# Patient Record
Sex: Female | Born: 1950 | Race: White | Hispanic: No | Marital: Married | Smoking: Never smoker
Health system: Southern US, Community
[De-identification: ages and names within clinical notes are randomized; demographics above are authoritative.]

## PROBLEM LIST (undated history)

## (undated) DIAGNOSIS — M1990 Unspecified osteoarthritis, unspecified site: Secondary | ICD-10-CM

## (undated) DIAGNOSIS — N6310 Unspecified lump in the right breast, unspecified quadrant: Secondary | ICD-10-CM

## (undated) DIAGNOSIS — Z8489 Family history of other specified conditions: Secondary | ICD-10-CM

## (undated) DIAGNOSIS — E039 Hypothyroidism, unspecified: Secondary | ICD-10-CM

## (undated) HISTORY — PX: COLONOSCOPY: SHX174

## (undated) HISTORY — PX: NO PAST SURGERIES: SHX2092

## (undated) HISTORY — PX: JOINT REPLACEMENT: SHX530

---

## 2016-09-28 ENCOUNTER — Other Ambulatory Visit: Payer: Self-pay | Admitting: Internal Medicine

## 2016-09-28 DIAGNOSIS — N6489 Other specified disorders of breast: Secondary | ICD-10-CM

## 2016-10-03 ENCOUNTER — Other Ambulatory Visit: Payer: Self-pay | Admitting: Internal Medicine

## 2016-10-03 DIAGNOSIS — N6489 Other specified disorders of breast: Secondary | ICD-10-CM

## 2016-10-04 ENCOUNTER — Ambulatory Visit
Admission: RE | Admit: 2016-10-04 | Discharge: 2016-10-04 | Disposition: A | Discharge status: Home / Self Care | Payer: BC Managed Care – PPO | Source: Ambulatory Visit | Attending: Internal Medicine | Admitting: Internal Medicine

## 2016-10-04 ENCOUNTER — Other Ambulatory Visit: Payer: Self-pay | Admitting: Internal Medicine

## 2016-10-04 DIAGNOSIS — N6489 Other specified disorders of breast: Secondary | ICD-10-CM

## 2017-03-22 ENCOUNTER — Ambulatory Visit: Payer: Self-pay | Admitting: Surgery

## 2017-03-22 DIAGNOSIS — N6091 Unspecified benign mammary dysplasia of right breast: Secondary | ICD-10-CM

## 2017-03-22 NOTE — H&P (Signed)
History of Present Illness  The patient is a 66 year old female who presents with a breast mass. Referred by Dr. Albertina Senegal for evaluation of atypical lobular hyperplasia right breast  Original mammograms performed in [place]  This is a 66 yo female who presents with an abnormal screening mammogram in early October 2017. Her last previous mammogram was in 2015. The screening mammogram showed some architectural distortion in the upper outer quadrant on the right. She underwent stereotactic biopsy which showed focal atypical lobular hyperplasia. A clip was placed. She presents now for surgical consultation.  Menarche - age 89 First pregnancy - age 31 Breastfeed - yes Hormones - OCP x 20 years Menopause - early 64's Family history - none  CLINICAL DATA: Screening detected architectural distortion involving the upper outer quadrant of the right breast at middle depth.  EXAM: RIGHT BREAST STEREOTACTIC CORE NEEDLE BIOPSY  COMPARISON: Previous exams.  FINDINGS: The patient and I discussed the procedure of stereotactic-guided biopsy including benefits and alternatives. We discussed the high likelihood of a successful procedure. We discussed the risks of the procedure including infection, bleeding, tissue injury, clip migration, and inadequate sampling. Informed written consent was given. The usual time out protocol was performed immediately prior to the procedure.  Using sterile technique with chlorhexidine as skin antisepsis, 1% lidocaine and 1% lidocaine with epinephrine as local anesthetic, under stereotactic guidance, a 9 gauge vacuum assisted device was used to perform core needle biopsy of architectural distortion in the upper outer quadrant of the right breast using a superior approach. Specimen radiograph was not performed as there are no associated calcifications.  At the conclusion of the procedure, a coil shaped tissue marker clip was deployed into the biopsy  cavity. Follow-up 2-view mammogram was performed and dictated separately.  IMPRESSION: Stereotactic-guided biopsy of architectural distortion involving the upper outer quadrant of the right breast. No apparent complications.  Electronically Signed: By: Hulan Saas M.D. On: 10/04/2016 10:31  ADDENDUM REPORT: 10/06/2016 14:13  ADDENDUM: Pathology revealed ATROPHIC BREAST TISSUE WITH FIBROCYSTIC CHANGES, PATCHY FIBROSIS AND FOCAL ATYPICAL LOBULAR HYPERPLASIA, BENIGN DUCTS WITH MICROCALCIFICATIONS of the upper outer quadrant of the Left breast. The focal atypical lobular hyperplasia may represent an incidental finding. This was found to be concordant by Dr. Quincy Carnes.  Pathology results were discussed with the patient by telephone. The patient reported doing well after the biopsy with tenderness at the site. Post biopsy instructions and care were reviewed and questions were answered. The patient was encouraged to call [facility] for any additional concerns. Surgical consultation has been arranged with Dr. Manus Rudd at [facility] on October 09, 2016.  Pathology results reported by Rene Kocher, RN on 10/06/2016.   Electronically Signed By: Hulan Saas M.D. On: 10/06/2016 14:13  Diagnosis Breast, right, needle core biopsy, upper inner quadrant at middle depth - ATROPHIC BREAST TISSUE WITH FIBROCYSTIC CHANGES, PATCHY FIBROSIS AND FOCAL ATYPICAL LOBULAR HYPERPLASIA. - BENIGN DUCTS WITH MICROCALCIFICATIONS. - SEE COMMENT. Microscopic Comment The focal atypical lobular hyperplasia may represent an incidental finding. Please correlate closely with clinical and radiologic impression. Dr. Leticia Penna has seen this case in consultation with agreement of the above diagnosis and comment. The findings are called to [facility] on 10/06/16. Of note, an E-Cadherin immunohistochemical stain is performed. The  morphology coupled with the staining pattern is consistent with the above diagnosis. (RH:ds 10/05/16) Zandra Abts MD Pathologist, Electronic Signature (Case signed 10/06/2016)   Other Problems Thyroid Disease  Past Surgical History Breast Biopsy  Right.  Diagnostic Studies History  Colonoscopy  >10 years ago Mammogram  within last year Pap Smear  1-5 years ago  Allergies  No Known Drug Allergies 10/09/2016  Medication History  Synthroid ( Tablet, Oral) Active. Medications Reconciled  Social History  Alcohol use  Occasional alcohol use. Caffeine use  Coffee, Tea. Illicit drug use  Remotely quit drug use. Tobacco use  Never smoker.  Family History Arthritis  Mother. Hypertension  Brother, Father, Mother. Prostate Cancer  Father. Thyroid problems  Mother.  Pregnancy / Birth History  Age at menarche  12 years. Age of menopause  37-55 Gravida  3 Length (months) of breastfeeding  >1 Maternal age  68-30 Para  2    Review of Systems  General Not Present- Appetite Loss, Chills, Fatigue, Fever, Night Sweats, Weight Gain and Weight Loss. Skin Not Present- Change in Wart/Mole, Dryness, Hives, Jaundice, New Lesions, Non-Healing Wounds, Rash and Ulcer. HEENT Not Present- Earache, Hearing Loss, Hoarseness, Nose Bleed, Oral Ulcers, Ringing in the Ears, Seasonal Allergies, Sinus Pain, Sore Throat, Visual Disturbances, Wears glasses/contact lenses and Yellow Eyes. Respiratory Not Present- Bloody sputum, Chronic Cough, Difficulty Breathing, Snoring and Wheezing. Breast Present- Breast Mass. Not Present- Breast Pain, Nipple Discharge and Skin Changes. Cardiovascular Not Present- Chest Pain, Difficulty Breathing Lying Down, Leg Cramps, Palpitations, Rapid Heart Rate, Shortness of Breath and Swelling of Extremities. Gastrointestinal Not Present- Abdominal Pain, Bloating, Bloody Stool, Change in Bowel Habits, Chronic diarrhea, Constipation, Difficulty  Swallowing, Excessive gas, Gets full quickly at meals, Hemorrhoids, Indigestion, Nausea, Rectal Pain and Vomiting. Female Genitourinary Not Present- Frequency, Nocturia, Painful Urination, Pelvic Pain and Urgency. Musculoskeletal Not Present- Back Pain, Joint Pain, Joint Stiffness, Muscle Pain, Muscle Weakness and Swelling of Extremities. Neurological Not Present- Decreased Memory, Fainting, Headaches, Numbness, Seizures, Tingling, Tremor, Trouble walking and Weakness. Psychiatric Not Present- Anxiety, Bipolar, Change in Sleep Pattern, Depression, Fearful and Frequent crying. Endocrine Not Present- Cold Intolerance, Excessive Hunger, Hair Changes, Heat Intolerance, Hot flashes and New Diabetes. Hematology Not Present- Blood Thinners, Easy Bruising, Excessive bleeding, Gland problems, HIV and Persistent Infections.  Vitals  Weight: 195 lb Height: 63in Body Surface Area: 1.91 m Body Mass Index: 34.54 kg/m  Temp.: 97.48F(Temporal)  Pulse: 79 (Regular)  BP: 132/80 (Sitting, Left Arm, Standard)       Physical Exam The physical exam findings are as follows: Note:WDWN in NAD Eyes: Pupils equal, round; sclera anicteric HENT: Oral mucosa moist; good dentition Neck: No masses palpated, no thyromegaly Breasts: symmetric; no nipple retraction or discharge; no palpable masses; no axillary lymphadenopathy Lungs: CTA bilaterally; normal respiratory effort CV: Regular rate and rhythm; no murmurs; extremities well-perfused with no edema Abd: +bowel sounds, soft, non-tender, no palpable organomegaly; no palpable hernias Skin: Warm, dry; no sign of jaundice Psychiatric - alert and oriented x 4; calm mood and affect    Assessment & Plan  ATYPICAL LOBULAR HYPERPLASIA (ALH) OF RIGHT BREAST (N60.91) Impression: Upper outer quadrant Current Plans We will proceed with a right radioactive seed localized lumpectomy.  The surgical procedure has been discussed with the patient.  Potential  risks, benefits, alternative treatments, and expected outcomes have been explained.  All of the patient's questions at this time have been answered.  The likelihood of reaching the patient's treatment goal is good.  The patient understand the proposed surgical procedure and wishes to proceed.  Note:I spent approximately 30 minutes of this 40 minute visit discussing the situation with the patient. We recommend right seed  localized lumpectomy to completely excise this area to rule out malignancy. I explained the entire procedure to the patient, including benefits and risks. I explained that the presence of atypical cells increased her overall risk of breast cancer.   Wilmon ArmsMatthew K. Corliss Skainssuei, MD, [facility]  General/ Trauma Surgery  03/22/2017 12:45 PM

## 2017-04-02 ENCOUNTER — Other Ambulatory Visit: Payer: Self-pay | Admitting: Surgery

## 2017-04-02 DIAGNOSIS — N6091 Unspecified benign mammary dysplasia of right breast: Secondary | ICD-10-CM

## 2017-05-08 ENCOUNTER — Encounter (HOSPITAL_BASED_OUTPATIENT_CLINIC_OR_DEPARTMENT_OTHER): Payer: Self-pay | Admitting: *Deleted

## 2017-05-14 ENCOUNTER — Ambulatory Visit
Admission: RE | Admit: 2017-05-14 | Discharge: 2017-05-14 | Disposition: A | Discharge status: Home / Self Care | Payer: Medicare Other | Source: Ambulatory Visit | Attending: Surgery | Admitting: Surgery

## 2017-05-14 DIAGNOSIS — N6091 Unspecified benign mammary dysplasia of right breast: Secondary | ICD-10-CM

## 2017-05-14 NOTE — Progress Notes (Signed)
Boost drink given with instructions to complete by 0400 dos, pt verbalized understanding. 

## 2017-05-15 ENCOUNTER — Ambulatory Visit (HOSPITAL_BASED_OUTPATIENT_CLINIC_OR_DEPARTMENT_OTHER): Payer: Medicare Other | Admitting: Anesthesiology

## 2017-05-15 ENCOUNTER — Ambulatory Visit
Admission: RE | Admit: 2017-05-15 | Discharge: 2017-05-15 | Disposition: A | Discharge status: Home / Self Care | Payer: Medicare Other | Source: Ambulatory Visit | Attending: Surgery | Admitting: Surgery

## 2017-05-15 ENCOUNTER — Ambulatory Visit (HOSPITAL_BASED_OUTPATIENT_CLINIC_OR_DEPARTMENT_OTHER)
Admission: RE | Admit: 2017-05-15 | Discharge: 2017-05-15 | Disposition: A | Discharge status: Home / Self Care | Payer: Medicare Other | Source: Ambulatory Visit | Attending: Surgery | Admitting: Surgery

## 2017-05-15 ENCOUNTER — Encounter (HOSPITAL_BASED_OUTPATIENT_CLINIC_OR_DEPARTMENT_OTHER)
Admission: RE | Disposition: A | Discharge status: Home / Self Care | Payer: Self-pay | Source: Ambulatory Visit | Attending: Surgery

## 2017-05-15 ENCOUNTER — Encounter (HOSPITAL_BASED_OUTPATIENT_CLINIC_OR_DEPARTMENT_OTHER): Payer: Self-pay | Admitting: *Deleted

## 2017-05-15 DIAGNOSIS — N6091 Unspecified benign mammary dysplasia of right breast: Secondary | ICD-10-CM | POA: Insufficient documentation

## 2017-05-15 DIAGNOSIS — Z8249 Family history of ischemic heart disease and other diseases of the circulatory system: Secondary | ICD-10-CM | POA: Diagnosis not present

## 2017-05-15 DIAGNOSIS — Z8042 Family history of malignant neoplasm of prostate: Secondary | ICD-10-CM | POA: Diagnosis not present

## 2017-05-15 DIAGNOSIS — Z8349 Family history of other endocrine, nutritional and metabolic diseases: Secondary | ICD-10-CM | POA: Diagnosis not present

## 2017-05-15 DIAGNOSIS — E039 Hypothyroidism, unspecified: Secondary | ICD-10-CM | POA: Diagnosis not present

## 2017-05-15 DIAGNOSIS — Z79890 Hormone replacement therapy (postmenopausal): Secondary | ICD-10-CM | POA: Diagnosis not present

## 2017-05-15 DIAGNOSIS — Z79899 Other long term (current) drug therapy: Secondary | ICD-10-CM | POA: Diagnosis not present

## 2017-05-15 DIAGNOSIS — Z8261 Family history of arthritis: Secondary | ICD-10-CM | POA: Diagnosis not present

## 2017-05-15 HISTORY — DX: Hypothyroidism, unspecified: E03.9

## 2017-05-15 HISTORY — DX: Unspecified lump in the right breast, unspecified quadrant: N63.10

## 2017-05-15 HISTORY — PX: BREAST LUMPECTOMY WITH RADIOACTIVE SEED LOCALIZATION: SHX6424

## 2017-05-15 SURGERY — BREAST LUMPECTOMY WITH RADIOACTIVE SEED LOCALIZATION
Anesthesia: General | Site: Breast | Laterality: Right

## 2017-05-15 MED ORDER — LIDOCAINE HCL (CARDIAC) 20 MG/ML IV SOLN
INTRAVENOUS | Status: DC | PRN
  Administered 2017-05-15: 50 mg via INTRAVENOUS

## 2017-05-15 MED ORDER — PROPOFOL 10 MG/ML IV BOLUS
INTRAVENOUS | Status: DC | PRN
  Administered 2017-05-15: 200 mg via INTRAVENOUS

## 2017-05-15 MED ORDER — DEXAMETHASONE SODIUM PHOSPHATE 10 MG/ML IJ SOLN
INTRAMUSCULAR | Status: AC
  Filled 2017-05-15: qty 1

## 2017-05-15 MED ORDER — FENTANYL CITRATE (PF) 100 MCG/2ML IJ SOLN
INTRAMUSCULAR | Status: AC
  Filled 2017-05-15: qty 2

## 2017-05-15 MED ORDER — ONDANSETRON HCL 4 MG/2ML IJ SOLN
INTRAMUSCULAR | Status: DC | PRN
  Administered 2017-05-15: 4 mg via INTRAVENOUS

## 2017-05-15 MED ORDER — LACTATED RINGERS IV SOLN: INTRAVENOUS | Status: DC

## 2017-05-15 MED ORDER — BUPIVACAINE HCL (PF) 0.25 % IJ SOLN
INTRAMUSCULAR | Status: AC
  Filled 2017-05-15: qty 30

## 2017-05-15 MED ORDER — BUPIVACAINE HCL (PF) 0.25 % IJ SOLN
INTRAMUSCULAR | Status: DC | PRN
  Administered 2017-05-15: 10 mL

## 2017-05-15 MED ORDER — HYDROCODONE-ACETAMINOPHEN 5-325 MG PO TABS: 1.0000 | ORAL_TABLET | Freq: Four times a day (QID) | ORAL | 0 refills | Status: DC | PRN

## 2017-05-15 MED ORDER — GABAPENTIN 300 MG PO CAPS
ORAL_CAPSULE | ORAL | Status: AC
  Filled 2017-05-15: qty 1

## 2017-05-15 MED ORDER — PHENYLEPHRINE 40 MCG/ML (10ML) SYRINGE FOR IV PUSH (FOR BLOOD PRESSURE SUPPORT)
PREFILLED_SYRINGE | INTRAVENOUS | Status: AC
  Filled 2017-05-15: qty 10

## 2017-05-15 MED ORDER — PROPOFOL 500 MG/50ML IV EMUL
INTRAVENOUS | Status: AC
  Filled 2017-05-15: qty 50

## 2017-05-15 MED ORDER — ROCURONIUM BROMIDE 10 MG/ML (PF) SYRINGE
PREFILLED_SYRINGE | INTRAVENOUS | Status: AC
  Filled 2017-05-15: qty 5

## 2017-05-15 MED ORDER — CEFAZOLIN SODIUM-DEXTROSE 2-4 GM/100ML-% IV SOLN
INTRAVENOUS | Status: AC
  Filled 2017-05-15: qty 100

## 2017-05-15 MED ORDER — CHLORHEXIDINE GLUCONATE CLOTH 2 % EX PADS: 6.0000 | MEDICATED_PAD | Freq: Once | CUTANEOUS | Status: DC

## 2017-05-15 MED ORDER — MIDAZOLAM HCL 2 MG/2ML IJ SOLN
1.0000 mg | INTRAMUSCULAR | Status: DC | PRN
  Administered 2017-05-15: 2 mg via INTRAVENOUS

## 2017-05-15 MED ORDER — CELECOXIB 400 MG PO CAPS
400.0000 mg | ORAL_CAPSULE | ORAL | Status: AC
  Administered 2017-05-15: 400 mg via ORAL

## 2017-05-15 MED ORDER — CELECOXIB 200 MG PO CAPS
ORAL_CAPSULE | ORAL | Status: AC
  Filled 2017-05-15: qty 2

## 2017-05-15 MED ORDER — FENTANYL CITRATE (PF) 100 MCG/2ML IJ SOLN: 25.0000 ug | INTRAMUSCULAR | Status: DC | PRN

## 2017-05-15 MED ORDER — EPHEDRINE SULFATE 50 MG/ML IJ SOLN
INTRAMUSCULAR | Status: DC | PRN
  Administered 2017-05-15: 10 mg via INTRAVENOUS

## 2017-05-15 MED ORDER — SCOPOLAMINE 1 MG/3DAYS TD PT72: 1.0000 | MEDICATED_PATCH | Freq: Once | TRANSDERMAL | Status: DC | PRN

## 2017-05-15 MED ORDER — ACETAMINOPHEN 500 MG PO TABS
ORAL_TABLET | ORAL | Status: AC
  Filled 2017-05-15: qty 2

## 2017-05-15 MED ORDER — GABAPENTIN 300 MG PO CAPS
300.0000 mg | ORAL_CAPSULE | ORAL | Status: AC
  Administered 2017-05-15: 300 mg via ORAL

## 2017-05-15 MED ORDER — EPHEDRINE 5 MG/ML INJ
INTRAVENOUS | Status: AC
  Filled 2017-05-15: qty 10

## 2017-05-15 MED ORDER — LACTATED RINGERS IV SOLN
INTRAVENOUS | Status: DC
  Administered 2017-05-15 (×3): via INTRAVENOUS

## 2017-05-15 MED ORDER — ONDANSETRON HCL 4 MG/2ML IJ SOLN
INTRAMUSCULAR | Status: AC
  Filled 2017-05-15: qty 2

## 2017-05-15 MED ORDER — FENTANYL CITRATE (PF) 100 MCG/2ML IJ SOLN
50.0000 ug | INTRAMUSCULAR | Status: DC | PRN
  Administered 2017-05-15: 100 ug via INTRAVENOUS

## 2017-05-15 MED ORDER — ACETAMINOPHEN 500 MG PO TABS
1000.0000 mg | ORAL_TABLET | ORAL | Status: AC
  Administered 2017-05-15: 1000 mg via ORAL

## 2017-05-15 MED ORDER — SUCCINYLCHOLINE CHLORIDE 200 MG/10ML IV SOSY
PREFILLED_SYRINGE | INTRAVENOUS | Status: AC
  Filled 2017-05-15: qty 10

## 2017-05-15 MED ORDER — METOCLOPRAMIDE HCL 5 MG/ML IJ SOLN: 10.0000 mg | Freq: Once | INTRAMUSCULAR | Status: DC | PRN

## 2017-05-15 MED ORDER — MIDAZOLAM HCL 2 MG/2ML IJ SOLN
INTRAMUSCULAR | Status: AC
  Filled 2017-05-15: qty 2

## 2017-05-15 MED ORDER — MEPERIDINE HCL 25 MG/ML IJ SOLN: 6.2500 mg | INTRAMUSCULAR | Status: DC | PRN

## 2017-05-15 MED ORDER — DEXAMETHASONE SODIUM PHOSPHATE 4 MG/ML IJ SOLN
INTRAMUSCULAR | Status: DC | PRN
  Administered 2017-05-15: 10 mg via INTRAVENOUS

## 2017-05-15 MED ORDER — LIDOCAINE 2% (20 MG/ML) 5 ML SYRINGE
INTRAMUSCULAR | Status: AC
  Filled 2017-05-15: qty 5

## 2017-05-15 MED ORDER — CEFAZOLIN SODIUM-DEXTROSE 2-4 GM/100ML-% IV SOLN
2.0000 g | INTRAVENOUS | Status: AC
  Administered 2017-05-15: 2 g via INTRAVENOUS

## 2017-05-15 SURGICAL SUPPLY — 53 items
APPLIER CLIP 9.375 MED OPEN (MISCELLANEOUS) ×2
BENZOIN TINCTURE PRP APPL 2/3 (GAUZE/BANDAGES/DRESSINGS) ×2 IMPLANT
BLADE HEX COATED 2.75 (ELECTRODE) ×2 IMPLANT
BLADE SURG 15 STRL LF DISP TIS (BLADE) ×1 IMPLANT
BLADE SURG 15 STRL SS (BLADE) ×1
CANISTER SUC SOCK COL 7IN (MISCELLANEOUS) IMPLANT
CANISTER SUCT 1200ML W/VALVE (MISCELLANEOUS) ×2 IMPLANT
CHLORAPREP W/TINT 26ML (MISCELLANEOUS) ×2 IMPLANT
CLIP APPLIE 9.375 MED OPEN (MISCELLANEOUS) ×1 IMPLANT
COVER BACK TABLE 60X90IN (DRAPES) ×2 IMPLANT
COVER MAYO STAND STRL (DRAPES) ×2 IMPLANT
COVER PROBE W GEL 5X96 (DRAPES) ×2 IMPLANT
DECANTER SPIKE VIAL GLASS SM (MISCELLANEOUS) IMPLANT
DEVICE DUBIN W/COMP PLATE 8390 (MISCELLANEOUS) ×2 IMPLANT
DRAPE LAPAROTOMY 100X72 PEDS (DRAPES) ×2 IMPLANT
DRAPE UTILITY XL STRL (DRAPES) ×2 IMPLANT
DRSG TEGADERM 4X4.75 (GAUZE/BANDAGES/DRESSINGS) ×2 IMPLANT
ELECT BLADE 4.0 EZ CLEAN MEGAD (MISCELLANEOUS) ×2
ELECT REM PT RETURN 9FT ADLT (ELECTROSURGICAL) ×2
ELECTRODE BLDE 4.0 EZ CLN MEGD (MISCELLANEOUS) ×1 IMPLANT
ELECTRODE REM PT RTRN 9FT ADLT (ELECTROSURGICAL) ×1 IMPLANT
GAUZE SPONGE 4X4 12PLY STRL LF (GAUZE/BANDAGES/DRESSINGS) IMPLANT
GLOVE BIO SURGEON STRL SZ7 (GLOVE) ×2 IMPLANT
GLOVE BIOGEL PI IND STRL 7.0 (GLOVE) ×1 IMPLANT
GLOVE BIOGEL PI IND STRL 7.5 (GLOVE) ×1 IMPLANT
GLOVE BIOGEL PI INDICATOR 7.0 (GLOVE) ×1
GLOVE BIOGEL PI INDICATOR 7.5 (GLOVE) ×1
GLOVE ECLIPSE 6.5 STRL STRAW (GLOVE) ×2 IMPLANT
GLOVE EXAM NITRILE EXT CUFF MD (GLOVE) ×2 IMPLANT
GOWN STRL REUS W/ TWL LRG LVL3 (GOWN DISPOSABLE) ×2 IMPLANT
GOWN STRL REUS W/TWL LRG LVL3 (GOWN DISPOSABLE) ×2
ILLUMINATOR WAVEGUIDE N/F (MISCELLANEOUS) IMPLANT
KIT MARKER MARGIN INK (KITS) ×2 IMPLANT
LIGHT WAVEGUIDE WIDE FLAT (MISCELLANEOUS) IMPLANT
NEEDLE HYPO 25X1 1.5 SAFETY (NEEDLE) ×2 IMPLANT
NS IRRIG 1000ML POUR BTL (IV SOLUTION) ×2 IMPLANT
PACK BASIN DAY SURGERY FS (CUSTOM PROCEDURE TRAY) ×2 IMPLANT
PENCIL BUTTON HOLSTER BLD 10FT (ELECTRODE) ×2 IMPLANT
SLEEVE SCD COMPRESS KNEE MED (MISCELLANEOUS) ×2 IMPLANT
SPONGE GAUZE 2X2 8PLY STRL LF (GAUZE/BANDAGES/DRESSINGS) IMPLANT
SPONGE LAP 18X18 X RAY DECT (DISPOSABLE) IMPLANT
SPONGE LAP 4X18 X RAY DECT (DISPOSABLE) ×2 IMPLANT
STRIP CLOSURE SKIN 1/2X4 (GAUZE/BANDAGES/DRESSINGS) ×2 IMPLANT
SUT MON AB 4-0 PC3 18 (SUTURE) ×2 IMPLANT
SUT SILK 2 0 SH (SUTURE) IMPLANT
SUT VIC AB 3-0 SH 27 (SUTURE) ×1
SUT VIC AB 3-0 SH 27X BRD (SUTURE) ×1 IMPLANT
SYR BULB 3OZ (MISCELLANEOUS) IMPLANT
SYR CONTROL 10ML LL (SYRINGE) ×2 IMPLANT
TOWEL OR 17X24 6PK STRL BLUE (TOWEL DISPOSABLE) ×2 IMPLANT
TOWEL OR NON WOVEN STRL DISP B (DISPOSABLE) IMPLANT
TUBE CONNECTING 20X1/4 (TUBING) ×2 IMPLANT
YANKAUER SUCT BULB TIP NO VENT (SUCTIONS) ×2 IMPLANT

## 2017-05-15 NOTE — Anesthesia Procedure Notes (Signed)
Procedure Name: LMA Insertion Date/Time: 05/15/2017 7:34 AM Performed by: Zenia ResidesPAYNE, LINDA D Pre-anesthesia Checklist: Patient identified, Emergency Drugs available, Suction available and Patient being monitored Patient Re-evaluated:Patient Re-evaluated prior to inductionOxygen Delivery Method: Circle system utilized Preoxygenation: Pre-oxygenation with 100% oxygen Intubation Type: IV induction Ventilation: Mask ventilation without difficulty LMA: LMA inserted LMA Size: 4.0 Number of attempts: 1 Airway Equipment and Method: Bite block Placement Confirmation: positive ETCO2 Tube secured with: Tape Dental Injury: Teeth and Oropharynx as per pre-operative assessment

## 2017-05-15 NOTE — Anesthesia Postprocedure Evaluation (Signed)
Anesthesia Post Note  Patient: Brittney Salinas  Procedure(s) Performed: Procedure(s) (LRB): RIGHT BREAST LUMPECTOMY WITH RADIOACTIVE SEED LOCALIZATION (Right)  Patient location during evaluation: PACU Anesthesia Type: General Level of consciousness: awake and alert Pain management: pain level controlled Vital Signs Assessment: post-procedure vital signs reviewed and stable Respiratory status: spontaneous breathing, nonlabored ventilation, respiratory function stable and patient connected to nasal cannula oxygen Cardiovascular status: blood pressure returned to baseline and stable Postop Assessment: no signs of nausea or vomiting Anesthetic complications: no       Last Vitals:  Vitals:   05/15/17 0908 05/15/17 0932  BP:  138/66  Pulse: 76 66  Resp: 17   Temp:  36.5 C    Last Pain:  Vitals:   05/15/17 0932  TempSrc: Oral  PainSc: 3                  Phillips Groutarignan, Peter

## 2017-05-15 NOTE — Discharge Instructions (Signed)
[facility] Office Phone Number [phone]  BREAST BIOPSY/ LUMPECTOMY: POST OP INSTRUCTIONS  Always review your discharge instruction sheet given to you by the facility where your surgery was performed.  IF YOU HAVE DISABILITY OR FAMILY LEAVE FORMS, YOU MUST BRING THEM TO THE OFFICE FOR PROCESSING.  DO NOT GIVE THEM TO YOUR DOCTOR.  1. A prescription for pain medication may be given to you upon discharge.  Take your pain medication as prescribed, if needed.  If narcotic pain medicine is not needed, then you may take acetaminophen (Tylenol) or ibuprofen (Advil) as needed. 2. Take your usually prescribed medications unless otherwise directed 3. If you need a refill on your pain medication, please contact your pharmacy.  They will contact our office to request authorization.  Prescriptions will not be filled after 5pm or on week-ends. 4. You should eat very light the first 24 hours after surgery, such as soup, crackers, pudding, etc.  Resume your normal diet the day after surgery. 5. Most patients will experience some swelling and bruising in the breast.  Ice packs and a good support bra will help.  Swelling and bruising can take several days to resolve.  6. It is common to experience some constipation if taking pain medication after surgery.  Increasing fluid intake and taking a stool softener will usually help or prevent this problem from occurring.  A mild laxative (Milk of Magnesia or Miralax) should be taken according to package directions if there are no bowel movements after 48 hours. 7. Unless discharge instructions indicate otherwise, you may remove your bandages 24-48 hours after surgery, and you may shower at that time.  You may have steri-strips (small skin tapes) in place directly over the incision.  These strips should be left on the skin for 7-10 days.  If your surgeon used skin glue on the incision, you may shower in 24 hours.  The glue will flake off over the next 2-3  weeks.  Any sutures or staples will be removed at the office during your follow-up visit. 8. ACTIVITIES:  You may resume regular daily activities (gradually increasing) beginning the next day.  Wearing a good support bra or sports bra minimizes pain and swelling.  You may have sexual intercourse when it is comfortable. a. You may drive when you no longer are taking prescription pain medication, you can comfortably wear a seatbelt, and you can safely maneuver your car and apply brakes. b. RETURN TO WORK:  ______________________________________________________________________________________ 9. You should see your doctor in the office for a follow-up appointment approximately two weeks after your surgery.  Your doctors nurse will typically make your follow-up appointment when she calls you with your pathology report.  Expect your pathology report 2-3 business days after your surgery.  You may call to check if you do not hear from us after three days.  WHEN TO CALL YOUR DOCTOR: 1. Fever over 101.0 2. Nausea and/or vomiting. 3. Extreme swelling or bruising. 4. Continued bleeding from incision. 5. Increased pain, redness, or drainage from the incision.  The clinic staff is available to answer your questions during regular business hours.  Please dont hesitate to call and ask to speak to one of the nurses for clinical concerns.  If you have a medical emergency, go to the nearest emergency room or call 911.  [facility] is always on call at the hospital.  For further questions, please visit [email or web address]    Post Anesthesia Home Care Instructions  Activity:  Get plenty of rest for the remainder of the day. A responsible individual must stay with you for 24 hours following the procedure.  For the next 24 hours, DO NOT: -Drive a car -Advertising copywriter -Drink alcoholic beverages -Take any medication unless instructed by your physician -Make any legal  decisions or sign important papers.  Meals: Start with liquid foods such as gelatin or soup. Progress to regular foods as tolerated. Avoid greasy, spicy, heavy foods. If nausea and/or vomiting occur, drink only clear liquids until the nausea and/or vomiting subsides. Call your physician if vomiting continues.  Special Instructions/Symptoms: Your throat may feel dry or sore from the anesthesia or the breathing tube placed in your throat during surgery. If this causes discomfort, gargle with warm salt water. The discomfort should disappear within 24 hours.  If you had a scopolamine patch placed behind your ear for the management of post- operative nausea and/or vomiting:  1. The medication in the patch is effective for 72 hours, after which it should be removed.  Wrap patch in a tissue and discard in the trash. Wash hands thoroughly with soap and water. 2. You may remove the patch earlier than 72 hours if you experience unpleasant side effects which may include dry mouth, dizziness or visual disturbances. 3. Avoid touching the patch. Wash your hands with soap and water after contact with the patch.

## 2017-05-15 NOTE — Anesthesia Preprocedure Evaluation (Addendum)
Anesthesia Evaluation  Patient identified by MRN, date of birth, ID band Patient awake    Reviewed: Allergy & Precautions, NPO status , Patient's Chart, lab work & pertinent test results  Airway Mallampati: II  TM Distance: >3 FB Neck ROM: Full    Dental no notable dental hx.    Pulmonary neg pulmonary ROS,    Pulmonary exam normal breath sounds clear to auscultation       Cardiovascular negative cardio ROS Normal cardiovascular exam Rhythm:Regular Rate:Normal     Neuro/Psych negative neurological ROS  negative psych ROS   GI/Hepatic negative GI ROS, Neg liver ROS,   Endo/Other  Hypothyroidism   Renal/GU negative Renal ROS  negative genitourinary   Musculoskeletal negative musculoskeletal ROS (+)   Abdominal   Peds negative pediatric ROS (+)  Hematology negative hematology ROS (+)   Anesthesia Other Findings   Reproductive/Obstetrics negative OB ROS                            Anesthesia Physical Anesthesia Plan  ASA: II  Anesthesia Plan: General   Post-op Pain Management:    Induction: Intravenous  Airway Management Planned: LMA  Additional Equipment:   Intra-op Plan:   Post-operative Plan: Extubation in OR  Informed Consent: I have reviewed the patients History and Physical, chart, labs and discussed the procedure including the risks, benefits and alternatives for the proposed anesthesia with the patient or authorized representative who has indicated his/her understanding and acceptance.   Dental advisory given  Plan Discussed with: CRNA  Anesthesia Plan Comments:         Anesthesia Quick Evaluation  

## 2017-05-15 NOTE — Op Note (Signed)
Pre-op Diagnosis:  Right breast atypical lobular hyperplasia Post-op Diagnosis: same Procedure:  Right radioactive seed localized lumpectomy Surgeon:  TSUEI,MATTHEW K. Anesthesia:  GEN - LMA Indications:  66 yo female presents with atypical lobular hyperplasia in the right upper outer quadrant of the breast.  After discussion with the patient, we have made the decision to proceed with lumpectomy.  A seed was placed yesterday by Radiology.  Description of procedure: The patient is brought to the operating room placed in supine position on the operating room table. After an adequate level of general anesthesia was obtained, her right breast was prepped with ChloraPrep and draped sterile fashion. A timeout was taken to ensure the proper patient and proper procedure. We interrogated the breast with the neoprobe. We made a circumareolar incision around the upper part of the nipple after infiltrating with 0.25% Marcaine. Dissection was carried down in the breast tissue with cautery. We used the neoprobe to guide [place] towards the radioactive seed. We excised an area of tissue around the radioactive seed with a diameter of about 2.5 cm. The specimen was removed and was oriented with a paint kit. Specimen mammogram showed the radioactive seed as well as the biopsy clip within the specimen. This was sent for pathologic examination. There is no residual radioactivity within the biopsy cavity. We inspected carefully for hemostasis. The wound was thoroughly irrigated. The wound was closed with a deep layer of 3-0 Vicryl and a subcuticular layer of 4-0 Monocryl. Benzoin Steri-Strips were applied. The patient was then extubated and brought to the recovery room in stable condition. All sponge, instrument, and needle counts are correct.  Imogene Burn. Georgette Dover, MD, [facility]  General/ Trauma Surgery  05/15/2017 8:25 AM

## 2017-05-15 NOTE — Transfer of Care (Signed)
Immediate Anesthesia Transfer of Care Note  Patient: Brittney Salinas  Procedure(s) Performed: Procedure(s): RIGHT BREAST LUMPECTOMY WITH RADIOACTIVE SEED LOCALIZATION (Right)  Patient Location: PACU  Anesthesia Type:General  Level of Consciousness: awake, alert  and oriented  Airway & Oxygen Therapy: Patient Spontanous Breathing and Patient connected to face mask oxygen  Post-op Assessment: Report given to RN and Post -op Vital signs reviewed and stable  Post vital signs: Reviewed and stable  Last Vitals:  Vitals:   05/15/17 0649  BP: 137/64  Resp: 16  Temp: 36.9 C    Last Pain:  Vitals:   05/15/17 0649  TempSrc: Oral      Patients Stated Pain Goal: 3 (05/15/17 0649)  Complications: No apparent anesthesia complications

## 2017-05-15 NOTE — H&P (Signed)
History of Present Illness  The patient is a 66 year old female who presents with a breast mass. Referred by Dr. Albertina Senegal for evaluation of atypical lobular hyperplasia right breast  Original mammograms performed in [place]  This is a 66 yo female who presents with an abnormal screening mammogram in early October 2017. Her last previous mammogram was in 2015. The screening mammogram showed some architectural distortion in the upper outer quadrant on the right. She underwent stereotactic biopsy which showed focal atypical lobular hyperplasia. A clip was placed. She presents now for surgical consultation.  Menarche - age 69 First pregnancy - age 79 Breastfeed - yes Hormones - OCP x 20 years Menopause - early 7's Family history - none  CLINICAL DATA: Screening detected architectural distortion involving the upper outer quadrant of the right breast at middle depth.  EXAM: RIGHT BREAST STEREOTACTIC CORE NEEDLE BIOPSY  COMPARISON: Previous exams.  FINDINGS: The patient and I discussed the procedure of stereotactic-guided biopsy including benefits and alternatives. We discussed the high likelihood of a successful procedure. We discussed the risks of the procedure including infection, bleeding, tissue injury, clip migration, and inadequate sampling. Informed written consent was given. The usual time out protocol was performed immediately prior to the procedure.  Using sterile technique with chlorhexidine as skin antisepsis, 1% lidocaine and 1% lidocaine with epinephrine as local anesthetic, under stereotactic guidance, a 9 gauge vacuum assisted device was used to perform core needle biopsy of architectural distortion in the upper outer quadrant of the right breast using a superior approach. Specimen radiograph was not performed as there are no associated calcifications.  At the conclusion of the procedure, a coil shaped tissue marker clip was deployed into  the biopsy cavity. Follow-up 2-view mammogram was performed and dictated separately.  IMPRESSION: Stereotactic-guided biopsy of architectural distortion involving the upper outer quadrant of the right breast. No apparent complications.  Electronically Signed: By: Hulan Saas M.D. On: 10/04/2016 10:31  ADDENDUM REPORT: 10/06/2016 14:13  ADDENDUM: Pathology revealed ATROPHIC BREAST TISSUE WITH FIBROCYSTIC CHANGES, PATCHY FIBROSIS AND FOCAL ATYPICAL LOBULAR HYPERPLASIA, BENIGN DUCTS WITH MICROCALCIFICATIONS of the upper outer quadrant of the Left breast. The focal atypical lobular hyperplasia may represent an incidental finding. This was found to be concordant by Dr. Quincy Carnes.  Pathology results were discussed with the patient by telephone. The patient reported doing well after the biopsy with tenderness at the site. Post biopsy instructions and care were reviewed and questions were answered. The patient was encouraged to call [facility] for any additional concerns. Surgical consultation has been arranged with Dr. Manus Rudd at [facility] on October 09, 2016.  Pathology results reported by Rene Kocher, RN on 10/06/2016.   Electronically Signed By: Hulan Saas M.D. On: 10/06/2016 14:13  Diagnosis Breast, right, needle core biopsy, upper inner quadrant at middle depth - ATROPHIC BREAST TISSUE WITH FIBROCYSTIC CHANGES, PATCHY FIBROSIS AND FOCAL ATYPICAL LOBULAR HYPERPLASIA. - BENIGN DUCTS WITH MICROCALCIFICATIONS. - SEE COMMENT. Microscopic Comment The focal atypical lobular hyperplasia may represent an incidental finding. Please correlate closely with clinical and radiologic impression. Dr. Leticia Penna has seen this case in consultation with agreement of the above diagnosis and comment. The findings are called to [facility] on 10/06/16. Of note, an E-Cadherin immunohistochemical stain  is performed. The morphology coupled with the staining pattern is consistent with the above diagnosis. (RH:ds 10/05/16) Zandra Abts MD Pathologist, Electronic Signature (Case signed 10/06/2016)   Other Problems Thyroid Disease  Past Surgical History Breast Biopsy  Right.  Diagnostic Studies History  Colonoscopy  >10 years ago Mammogram  within last year Pap Smear  1-5 years ago  Allergies  No Known Drug Allergies 10/09/2016  Medication History  Synthroid ( Tablet, Oral) Active. Medications Reconciled  Social History  Alcohol use  Occasional alcohol use. Caffeine use  Coffee, Tea. Illicit drug use  Remotely quit drug use. Tobacco use  Never smoker.  Family History Arthritis  Mother. Hypertension  Brother, Father, Mother. Prostate Cancer  Father. Thyroid problems  Mother.  Pregnancy / Birth History  Age at menarche  12 years. Age of menopause  39-55 Gravida  3 Length (months) of breastfeeding  >48 Maternal age  72-30 Para  2    Review of Systems  General Not Present- Appetite Loss, Chills, Fatigue, Fever, Night Sweats, Weight Gain and Weight Loss. Skin Not Present- Change in Wart/Mole, Dryness, Hives, Jaundice, New Lesions, Non-Healing Wounds, Rash and Ulcer. HEENT Not Present- Earache, Hearing Loss, Hoarseness, Nose Bleed, Oral Ulcers, Ringing in the Ears, Seasonal Allergies, Sinus Pain, Sore Throat, Visual Disturbances, Wears glasses/contact lenses and Yellow Eyes. Respiratory Not Present- Bloody sputum, Chronic Cough, Difficulty Breathing, Snoring and Wheezing. Breast Present- Breast Mass. Not Present- Breast Pain, Nipple Discharge and Skin Changes. Cardiovascular Not Present- Chest Pain, Difficulty Breathing Lying Down, Leg Cramps, Palpitations, Rapid Heart Rate, Shortness of Breath and Swelling of Extremities. Gastrointestinal Not Present- Abdominal Pain, Bloating, Bloody Stool, Change in Bowel Habits, Chronic  diarrhea, Constipation, Difficulty Swallowing, Excessive gas, Gets full quickly at meals, Hemorrhoids, Indigestion, Nausea, Rectal Pain and Vomiting. Female Genitourinary Not Present- Frequency, Nocturia, Painful Urination, Pelvic Pain and Urgency. Musculoskeletal Not Present- Back Pain, Joint Pain, Joint Stiffness, Muscle Pain, Muscle Weakness and Swelling of Extremities. Neurological Not Present- Decreased Memory, Fainting, Headaches, Numbness, Seizures, Tingling, Tremor, Trouble walking and Weakness. Psychiatric Not Present- Anxiety, Bipolar, Change in Sleep Pattern, Depression, Fearful and Frequent crying. Endocrine Not Present- Cold Intolerance, Excessive Hunger, Hair Changes, Heat Intolerance, Hot flashes and New Diabetes. Hematology Not Present- Blood Thinners, Easy Bruising, Excessive bleeding, Gland problems, HIV and Persistent Infections.  Vitals  Weight: 195 lb Height: 63in Body Surface Area: 1.91 m Body Mass Index: 34.54 kg/m  Temp.: 97.47F(Temporal)  Pulse: 79 (Regular)  BP: 132/80 (Sitting, Left Arm, Standard)       Physical Exam The physical exam findings are as follows: Note:WDWN in NAD Eyes: Pupils equal, round; sclera anicteric HENT: Oral mucosa moist; good dentition Neck: No masses palpated, no thyromegaly Breasts: symmetric; no nipple retraction or discharge; no palpable masses; no axillary lymphadenopathy Lungs: CTA bilaterally; normal respiratory effort CV: Regular rate and rhythm; no murmurs; extremities well-perfused with no edema Abd: +bowel sounds, soft, non-tender, no palpable organomegaly; no palpable hernias Skin: Warm, dry; no sign of jaundice Psychiatric - alert and oriented x 4; calm mood and affect    Assessment & Plan  ATYPICAL LOBULAR HYPERPLASIA (ALH) OF RIGHT BREAST (N60.91) Impression: Upper outer quadrant Current Plans We will proceed with a right radioactive seed localized lumpectomy.  The surgical procedure has  been discussed with the patient.  Potential risks, benefits, alternative treatments, and expected outcomes have been explained.  All of the patient's questions at this time have been answered.  The likelihood of reaching the patient's treatment goal is good.  The patient understand the proposed surgical procedure and wishes to proceed.  Note:I spent approximately 30 minutes of this 40 minute visit discussing the situation with the patient. We recommend right seed  localized lumpectomy to completely excise this area to rule out malignancy. I explained the entire procedure to the patient, including benefits and risks. I explained that the presence of atypical cells increased her overall risk of breast cancer.  Wilmon ArmsMatthew K. Corliss Skainssuei, MD, [facility]  General/ Trauma Surgery  05/15/2017 7:13 AM

## 2017-05-16 ENCOUNTER — Encounter (HOSPITAL_BASED_OUTPATIENT_CLINIC_OR_DEPARTMENT_OTHER): Payer: Self-pay | Admitting: Surgery

## 2017-11-15 NOTE — H&P (Signed)
TOTAL HIP ADMISSION H&P  Patient is admitted for right total hip arthroplasty, anterior approach.  Subjective:  Chief Complaint:     Right hip primary OA / pain  HPI: Brittney Salinas, 66 y.o. female, has a history of pain and functional disability in the right hip(s) due to arthritis and patient has failed non-surgical conservative treatments for greater than 12 weeks to include NSAID's and/or analgesics, corticosteriod injections and activity modification.  Onset of symptoms was gradual starting 1+ years ago with gradually worsening course since that time.The patient noted no past surgery on the right hip(s).  Patient currently rates pain in the right hip at 7 out of 10 with activity. Patient has night pain, worsening of pain with activity and weight bearing, trendelenberg gait, pain that interfers with activities of daily living and pain with passive range of motion. Patient has evidence of periarticular osteophytes and joint space narrowing by imaging studies. This condition presents safety issues increasing the risk of falls.   There is no current active infection.   Risks, benefits and expectations were discussed with the patient.  Risks including but not limited to the risk of anesthesia, blood clots, nerve damage, blood vessel damage, failure of the prosthesis, infection and up to and including death.  Patient understand the risks, benefits and expectations and wishes to proceed with surgery.   PCP: Albertina SenegalPollock, Nelson, MD  D/C Plans:       Home   Post-op Meds:       No Rx given-  Tranexamic Acid:      To be given - IV   Decadron:      Is to be given  FYI:     ASA  Norco  DME:     Rx given for - RW   PT:      No PT     Past Medical History:  Diagnosis Date  . Breast mass, right   . Hypothyroidism     Past Surgical History:  Procedure Laterality Date  . BREAST LUMPECTOMY WITH RADIOACTIVE SEED LOCALIZATION Right 05/15/2017   Procedure: RIGHT BREAST LUMPECTOMY WITH  RADIOACTIVE SEED LOCALIZATION;  Surgeon: Manus Ruddsuei, Matthew, MD;  Location: [facility];  Service: General;  Laterality: Right;  . COLONOSCOPY    . NO PAST SURGERIES      No current facility-administered medications for this encounter.    Current Outpatient Medications  Medication Sig Dispense Refill Last Dose  . Cholecalciferol (VITAMIN D3) 5000 units CAPS Take 5,000 Units by mouth daily.     Marland Kitchen. levothyroxine (SYNTHROID, LEVOTHROID) 75 MCG tablet Take 75 mcg by mouth daily before breakfast.   05/15/2017 at Unknown time  . Magnesium 400 MG TABS Take 400 mg by mouth daily.     . Methylcobalamin (B-12) 1000 MCG TBDP Take 1,000 mcg by mouth daily.    Past Month at Unknown time  . Misc Natural Products (GLUCOSAMINE CHONDROITIN MSM) TABS Take 2 tablets by mouth daily. With tumeric     . Multiple Minerals-Vitamins (CAL MAG ZINC +D3) TABS Take 1 tablet by mouth daily.     . Multiple Vitamin (MULTIVITAMIN WITH MINERALS) TABS tablet Take 1 tablet by mouth daily.   Past Month at Unknown time  . Multiple Vitamins-Minerals (HAIR/SKIN/NAILS) TABS Take 1-2 tablets by mouth See admin instructions. Take 2 tablets in the morning and 1 tablet in the afternoon      No Known Allergies   Social History   Tobacco Use  . Smoking status: Never Smoker  .  Smokeless tobacco: Never Used  Substance Use Topics  . Alcohol use: Yes    Comment: social       Review of Systems  Constitutional: Negative.   HENT: Negative.   Eyes: Negative.   Respiratory: Negative.   Cardiovascular: Negative.   Gastrointestinal: Negative.   Genitourinary: Negative.   Musculoskeletal: Positive for joint pain.  Skin: Negative.   Neurological: Negative.   Endo/Heme/Allergies: Negative.   Psychiatric/Behavioral: Negative.     Objective:  Physical Exam  Constitutional: She is oriented to person, place, and time. She appears well-developed.  HENT:  Head: Normocephalic.  Eyes: Pupils are equal, round, and reactive to  light.  Neck: Neck supple. No JVD present. No tracheal deviation present. No thyromegaly present.  Cardiovascular: Normal rate, regular rhythm and intact distal pulses.  Respiratory: Effort normal and breath sounds normal. No respiratory distress. She has no wheezes.  GI: Soft. There is no tenderness. There is no guarding.  Musculoskeletal:       Right hip: She exhibits decreased range of motion, decreased strength, tenderness and bony tenderness. She exhibits no swelling, no deformity and no laceration.  Lymphadenopathy:    She has no cervical adenopathy.  Neurological: She is alert and oriented to person, place, and time. A sensory deficit (occassional tingling left LE) is present.  Skin: Skin is warm and dry.  Psychiatric: She has a normal mood and affect.       Labs:  Estimated body mass index is 34.03 kg/m as calculated from the following:   Height as of 05/15/17: 5\' 4"  (1.626 m).   Weight as of 05/15/17: 89.9 kg (198 lb 4 oz).   Imaging Review Plain radiographs demonstrate severe degenerative joint disease of the right hip(s). The bone quality appears to be good for age and reported activity level.  Assessment/Plan:  End stage arthritis, right hip(s)  The patient history, physical examination, clinical judgement of the provider and imaging studies are consistent with end stage degenerative joint disease of the right hip(s) and total hip arthroplasty is deemed medically necessary. The treatment options including medical management, injection therapy, arthroscopy and arthroplasty were discussed at length. The risks and benefits of total hip arthroplasty were presented and reviewed. The risks due to aseptic loosening, infection, stiffness, dislocation/subluxation,  thromboembolic complications and other imponderables were discussed.  The patient acknowledged the explanation, agreed to proceed with the plan and consent was signed. Patient is being admitted for inpatient treatment for  surgery, pain control, PT, OT, prophylactic antibiotics, VTE prophylaxis, progressive ambulation and ADL's and discharge planning.The patient is planning to be discharged home.      Anastasio AuerbachMatthew S. Babish   PA-C  11/15/2017, 9:01 AM

## 2017-11-21 NOTE — Patient Instructions (Addendum)
Brittney Salinas  11/21/2017   Your procedure is scheduled on: 11-27-17  Report [facility] Main  Entr[facility] .  Follow signs to Short Stay on first floor at   0530 AM  Call this number if you have problems the morning of surgery  [phone]   Remember:  AFTER YOU ARE READY FOR SURGERY : ONLY 1 PERSON MAYBE ALLOWED TO VISIT YOU IN THE SHORT STAY AREA   Do not eat food or drink liquids :After Midnight.     Take these medicines the morning of surgery with A SIP OF WATER: LEVOTHYROXINE                                You may not have any metal on your body including hair pins and              piercings  Do not wear jewelry, make-up, lotions, powders or perfumes, deodorant             Do not wear nail polish.  Do not shave  48 hours prior to surgery.                Do not bring valuables to the hospital. [facility] IS NOT             RESPONSIBLE   FOR VALUABLES.  Contacts, dentures or bridgework may not be worn into surgery.  Leave suitcase in the car. After surgery it may be brought to your room.               Please read over the following fact sheets you were given: _____________________________________________________________________          [facility] - Preparing for [facility] Before surgery, you can play an important role.  Because skin is not sterile, your skin needs to be as free of germs as possible.  You can reduce the number of germs on your skin by washing with CHG (chlorahexidine gluconate) soap before surgery.  CHG is an antiseptic cleaner which kills germs and bonds with the skin to continue killing germs even after washing. Please DO NOT use if you have an allergy to CHG or antibacterial soaps.  If your skin becomes reddened/irritated stop using the CHG and inform your nurse when you arrive at Short Stay. Do not shave (including legs and underarms) for at least 48 hours prior to the first CHG shower.  You may shave your  face/neck. Please follow these instructions carefully:  1.  Shower with CHG Soap the night before surgery and the  morning of Surgery.  2.  If you choose to wash your hair, wash your hair first as usual with your  normal  shampoo.  3.  After you shampoo, rinse your hair and body thoroughly to remove the  shampoo.                           4.  Use CHG as you would any other liquid soap.  You can apply chg directly  to the skin and wash                       Gently with a scrungie or clean washcloth.  5.  Apply the CHG Soap to your body ONLY FROM THE NECK  DOWN.   Do not use on face/ open                           Wound or open sores. Avoid contact with eyes, ears mouth and genitals (private parts).                       Wash face,  Genitals (private parts) with your normal soap.             6.  Wash thoroughly, paying special attention to the area where your surgery  will be performed.  7.  Thoroughly rinse your body with warm water from the neck down.  8.  DO NOT shower/wash with your normal soap after using and rinsing off  the CHG Soap.                9.  Pat yourself dry with a clean towel.            10.  Wear clean pajamas.            11.  Place clean sheets on your bed the night of your first shower and do not  sleep with pets. Day of Surgery : Do not apply any lotions/deodorants the morning of surgery.  Please wear clean clothes to the hospital/surgery center.  FAILURE TO FOLLOW THESE INSTRUCTIONS MAY RESULT IN THE CANCELLATION OF YOUR SURGERY PATIENT SIGNATURE_________________________________  NURSE SIGNATURE__________________________________  ________________________________________________________________________  WHAT IS A BLOOD TRANSFUSION? Blood Transfusion Information  A transfusion is the replacement of blood or some of its parts. Blood is made up of multiple cells which provide different functions.  Red blood cells carry oxygen and are used for blood loss  replacement.  White blood cells fight against infection.  Platelets control bleeding.  Plasma helps clot blood.  Other blood products are available for specialized needs, such as hemophilia or other clotting disorders. BEFORE THE TRANSFUSION  Who gives blood for transfusions?   Healthy volunteers who are fully evaluated to make sure their blood is safe. This is blood bank blood. Transfusion therapy is the safest it has ever been in the practice of medicine. Before blood is taken from a donor, a complete history is taken to make sure that person has no history of diseases nor engages in risky social behavior (examples are intravenous drug use or sexual activity with multiple partners). The donor's travel history is screened to minimize risk of transmitting infections, such as malaria. The donated blood is tested for signs of infectious diseases, such as HIV and hepatitis. The blood is then tested to be sure it is compatible with you in order to minimize the chance of a transfusion reaction. If you or a relative donates blood, this is often done in anticipation of surgery and is not appropriate for emergency situations. It takes many days to process the donated blood. RISKS AND COMPLICATIONS Although transfusion therapy is very safe and saves many lives, the main dangers of transfusion include:   Getting an infectious disease.  Developing a transfusion reaction. This is an allergic reaction to something in the blood you were given. Every precaution is taken to prevent this. The decision to have a blood transfusion has been considered carefully by your caregiver before blood is given. Blood is not given unless the benefits outweigh the risks. AFTER THE TRANSFUSION  Right after receiving a blood transfusion, you will usually feel much better and more energetic.  This is especially true if your red blood cells have gotten low (anemic). The transfusion raises the level of the red blood cells which  carry oxygen, and this usually causes an energy increase.  The nurse administering the transfusion will monitor you carefully for complications. HOME CARE INSTRUCTIONS  No special instructions are needed after a transfusion. You may find your energy is better. Speak with your caregiver about any limitations on activity for underlying diseases you may have. SEEK MEDICAL CARE IF:   Your condition is not improving after your transfusion.  You develop redness or irritation at the intravenous (IV) site. SEEK IMMEDIATE MEDICAL CARE IF:  Any of the following symptoms occur over the next 12 hours:  Shaking chills.  You have a temperature by mouth above 102 F (38.9 C), not controlled by medicine.  Chest, back, or muscle pain.  People around you feel you are not acting correctly or are confused.  Shortness of breath or difficulty breathing.  Dizziness and fainting.  You get a rash or develop hives.  You have a decrease in urine output.  Your urine turns a dark color or changes to pink, red, or brown. Any of the following symptoms occur over the next 10 days:  You have a temperature by mouth above 102 F (38.9 C), not controlled by medicine.  Shortness of breath.  Weakness after normal activity.  The white part of the eye turns yellow (jaundice).  You have a decrease in the amount of urine or are urinating less often.  Your urine turns a dark color or changes to pink, red, or brown. Document Released: 12/08/2000 Document Revised: 03/04/2012 Document Reviewed: 07/27/2008 ExitCare Patient Information 2014 [place], [place].  _______________________________________________________________________  Incentive Spirometer  An incentive spirometer is a tool that can help keep your lungs clear and active. This tool measures how well you are filling your lungs with each breath. Taking long deep breaths may help reverse or decrease the chance of developing breathing (pulmonary) problems  (especially infection) following:  A long period of time when you are unable to move or be active. BEFORE THE PROCEDURE   If the spirometer includes an indicator to show your best effort, your nurse or respiratory therapist will set it to a desired goal.  If possible, sit up straight or lean slightly forward. Try not to slouch.  Hold the incentive spirometer in an upright position. INSTRUCTIONS FOR USE  1. Sit on the edge of your bed if possible, or sit up as far as you can in bed or on a chair. 2. Hold the incentive spirometer in an upright position. 3. Breathe out normally. 4. Place the mouthpiece in your mouth and seal your lips tightly around it. 5. Breathe in slowly and as deeply as possible, raising the piston or the ball toward the top of the column. 6. Hold your breath for 3-5 seconds or for as long as possible. Allow the piston or ball to fall to the bottom of the column. 7. Remove the mouthpiece from your mouth and breathe out normally. 8. Rest for a few seconds and repeat Steps 1 through 7 at least 10 times every 1-2 hours when you are awake. Take your time and take a few normal breaths between deep breaths. 9. The spirometer may include an indicator to show your best effort. Use the indicator as a goal to work toward during each repetition. 10. After each set of 10 deep breaths, practice coughing to be sure your lungs are clear.  If you have an incision (the cut made at the time of surgery), support your incision when coughing by placing a pillow or rolled up towels firmly against it. Once you are able to get out of bed, walk around indoors and cough well. You may stop using the incentive spirometer when instructed by your caregiver.  RISKS AND COMPLICATIONS  Take your time so you do not get dizzy or light-headed.  If you are in pain, you may need to take or ask for pain medication before doing incentive spirometry. It is harder to take a deep breath if you are having  pain. AFTER USE  Rest and breathe slowly and easily.  It can be helpful to keep track of a log of your progress. Your caregiver can provide you with a simple table to help with this. If you are using the spirometer at home, follow these instructions: [place] IF:   You are having difficultly using the spirometer.  You have trouble using the spirometer as often as instructed.  Your pain medication is not giving enough relief while using the spirometer.  You develop fever of 100.5 F (38.1 C) or higher. SEEK IMMEDIATE MEDICAL CARE IF:   You cough up bloody sputum that had not been present before.  You develop fever of 102 F (38.9 C) or greater.  You develop worsening pain at or near the incision site. MAKE SURE YOU:   Understand these instructions.  Will watch your condition.  Will get help right away if you are not doing well or get worse. Document Released: 04/23/2007 Document Revised: 03/04/2012 Document Reviewed: 06/24/2007 [facility] Patient Information 2014 [place], [place].   ________________________________________________________________________

## 2017-11-22 ENCOUNTER — Other Ambulatory Visit: Payer: Self-pay

## 2017-11-22 ENCOUNTER — Encounter (HOSPITAL_COMMUNITY)
Admission: RE | Admit: 2017-11-22 | Discharge: 2017-11-22 | Disposition: A | Discharge status: Home / Self Care | Payer: Medicare Other | Source: Ambulatory Visit | Attending: Orthopedic Surgery | Admitting: Orthopedic Surgery

## 2017-11-22 ENCOUNTER — Encounter (HOSPITAL_COMMUNITY): Payer: Self-pay

## 2017-11-22 ENCOUNTER — Encounter (INDEPENDENT_AMBULATORY_CARE_PROVIDER_SITE_OTHER): Payer: Self-pay

## 2017-11-22 DIAGNOSIS — M1611 Unilateral primary osteoarthritis, right hip: Secondary | ICD-10-CM | POA: Insufficient documentation

## 2017-11-22 DIAGNOSIS — Z01818 Encounter for other preprocedural examination: Secondary | ICD-10-CM | POA: Insufficient documentation

## 2017-11-22 HISTORY — DX: Family history of other specified conditions: Z84.89

## 2017-11-22 HISTORY — DX: Unspecified osteoarthritis, unspecified site: M19.90

## 2017-11-22 LAB — SURGICAL PCR SCREEN
MRSA, PCR: NEGATIVE
Staphylococcus aureus: NEGATIVE

## 2017-11-22 LAB — BASIC METABOLIC PANEL
Anion gap: 7 (ref 5–15)
BUN: 16 mg/dL (ref 6–20)
CO2: 28 mmol/L (ref 22–32)
Calcium: 9.7 mg/dL (ref 8.9–10.3)
Chloride: 102 mmol/L (ref 101–111)
Creatinine, Ser: 0.82 mg/dL (ref 0.44–1.00)
GFR calc Af Amer: >60 mL/min (ref >60)
GFR calc non Af Amer: >60 mL/min (ref >60)
Glucose, Bld: 101 mg/dL — ABNORMAL HIGH (ref 65–99)
Potassium: 4.2 mmol/L (ref 3.5–5.1)
Sodium: 137 mmol/L (ref 135–145)

## 2017-11-22 LAB — CBC
HCT: 41 % (ref 36.0–46.0)
Hemoglobin: 13.8 g/dL (ref 12.0–15.0)
MCH: 30.3 pg (ref 26.0–34.0)
MCHC: 33.7 g/dL (ref 30.0–36.0)
MCV: 89.9 fL (ref 78.0–100.0)
Platelets: 224 10*3/uL (ref 150–400)
RBC: 4.56 MIL/uL (ref 3.87–5.11)
RDW: 13.2 % (ref 11.5–15.5)
WBC: 10.1 10*3/uL (ref 4.0–10.5)

## 2017-11-22 NOTE — Progress Notes (Signed)
FYI pt. Presented at preop with cold symptoms head congestion stuffy nose . Her surgery is 11-27-17 I advised her to see her PCP asap to be evatuate or her surgery could be cancelled.She had no fever at preop.

## 2017-11-23 LAB — ABO/RH: ABO/RH(D): O POS

## 2017-11-23 NOTE — Progress Notes (Signed)
Clearance Dr. Julius BowelsPollock 07-20-17 chart

## 2017-11-26 NOTE — Anesthesia Preprocedure Evaluation (Signed)
Anesthesia Evaluation  Patient identified by MRN, date of birth, ID band Patient awake    Reviewed: Allergy & Precautions, H&P , Patient's Chart, lab work & pertinent test results, reviewed documented beta blocker date and time   Airway Mallampati: II  TM Distance: >3 FB Neck ROM: full    Dental no notable dental hx.    Pulmonary    Pulmonary exam normal breath sounds clear to auscultation       Cardiovascular  Rhythm:regular Rate:Normal     Neuro/Psych    GI/Hepatic   Endo/Other    Renal/GU      Musculoskeletal   Abdominal   Peds  Hematology   Anesthesia Other Findings   Reproductive/Obstetrics                             Anesthesia Physical Anesthesia Plan  ASA: II  Anesthesia Plan: Spinal   Post-op Pain Management:    Induction:   PONV Risk Score and Plan: 1 and Dexamethasone, Ondansetron and Treatment may vary due to age or medical condition  Airway Management Planned:   Additional Equipment:   Intra-op Plan:   Post-operative Plan:   Informed Consent: I have reviewed the patients History and Physical, chart, labs and discussed the procedure including the risks, benefits and alternatives for the proposed anesthesia with the patient or authorized representative who has indicated his/her understanding and acceptance.   Dental Advisory Given  Plan Discussed with: CRNA and Surgeon  Anesthesia Plan Comments: (  )        Anesthesia Quick Evaluation

## 2017-11-27 ENCOUNTER — Other Ambulatory Visit: Payer: Self-pay

## 2017-11-27 ENCOUNTER — Encounter (HOSPITAL_COMMUNITY): Payer: Self-pay | Admitting: *Deleted

## 2017-11-27 ENCOUNTER — Encounter (HOSPITAL_COMMUNITY)
Admission: RE | Disposition: A | Discharge status: Home / Self Care | Payer: Self-pay | Source: Ambulatory Visit | Attending: Orthopedic Surgery

## 2017-11-27 ENCOUNTER — Inpatient Hospital Stay (HOSPITAL_COMMUNITY): Payer: Medicare Other | Admitting: Anesthesiology

## 2017-11-27 ENCOUNTER — Inpatient Hospital Stay (HOSPITAL_COMMUNITY): Payer: Medicare Other

## 2017-11-27 ENCOUNTER — Inpatient Hospital Stay (HOSPITAL_COMMUNITY)
Admission: RE | Admit: 2017-11-27 | Discharge: 2017-11-28 | DRG: 470 | Disposition: A | Discharge status: Home / Self Care | Payer: Medicare Other | Source: Ambulatory Visit | Attending: Orthopedic Surgery | Admitting: Orthopedic Surgery

## 2017-11-27 DIAGNOSIS — Z79890 Hormone replacement therapy (postmenopausal): Secondary | ICD-10-CM

## 2017-11-27 DIAGNOSIS — Z96649 Presence of unspecified artificial hip joint: Secondary | ICD-10-CM

## 2017-11-27 DIAGNOSIS — Z96641 Presence of right artificial hip joint: Secondary | ICD-10-CM

## 2017-11-27 DIAGNOSIS — E039 Hypothyroidism, unspecified: Secondary | ICD-10-CM | POA: Diagnosis present

## 2017-11-27 DIAGNOSIS — M1611 Unilateral primary osteoarthritis, right hip: Principal | ICD-10-CM | POA: Diagnosis present

## 2017-11-27 DIAGNOSIS — M25551 Pain in right hip: Secondary | ICD-10-CM

## 2017-11-27 HISTORY — PX: TOTAL HIP ARTHROPLASTY: SHX124

## 2017-11-27 LAB — TYPE AND SCREEN
ABO/RH(D): O POS
Antibody Screen: NEGATIVE

## 2017-11-27 SURGERY — ARTHROPLASTY, HIP, TOTAL, ANTERIOR APPROACH
Anesthesia: Spinal | Site: Hip | Laterality: Right

## 2017-11-27 MED ORDER — VITAMIN D 1000 UNITS PO TABS
5000.0000 [IU] | ORAL_TABLET | Freq: Every day | ORAL | Status: DC
  Administered 2017-11-28: 5000 [IU] via ORAL
  Filled 2017-11-27: qty 5

## 2017-11-27 MED ORDER — ONDANSETRON HCL 4 MG PO TABS: 4.0000 mg | ORAL_TABLET | Freq: Four times a day (QID) | ORAL | Status: DC | PRN

## 2017-11-27 MED ORDER — CEFAZOLIN SODIUM-DEXTROSE 2-4 GM/100ML-% IV SOLN
INTRAVENOUS | Status: AC
Start: 2017-11-27 — End: 2017-11-27
  Filled 2017-11-27: qty 100

## 2017-11-27 MED ORDER — EPHEDRINE 5 MG/ML INJ
INTRAVENOUS | Status: AC
  Filled 2017-11-27: qty 10

## 2017-11-27 MED ORDER — DEXAMETHASONE SODIUM PHOSPHATE 10 MG/ML IJ SOLN
INTRAMUSCULAR | Status: AC
  Filled 2017-11-27: qty 2

## 2017-11-27 MED ORDER — ACETAMINOPHEN 325 MG PO TABS: 650.0000 mg | ORAL_TABLET | ORAL | Status: DC | PRN

## 2017-11-27 MED ORDER — MAGNESIUM 400 MG PO TABS: 400.0000 mg | ORAL_TABLET | Freq: Every day | ORAL | Status: DC

## 2017-11-27 MED ORDER — MIDAZOLAM HCL 5 MG/5ML IJ SOLN
INTRAMUSCULAR | Status: DC | PRN
  Administered 2017-11-27: 2 mg via INTRAVENOUS

## 2017-11-27 MED ORDER — HYDROMORPHONE HCL 1 MG/ML IJ SOLN
INTRAMUSCULAR | Status: AC
  Filled 2017-11-27: qty 1

## 2017-11-27 MED ORDER — FERROUS SULFATE 325 (65 FE) MG PO TABS
325.0000 mg | ORAL_TABLET | Freq: Three times a day (TID) | ORAL | Status: DC
  Administered 2017-11-28 (×2): 325 mg via ORAL
  Filled 2017-11-27 (×2): qty 1

## 2017-11-27 MED ORDER — MIDAZOLAM HCL 2 MG/2ML IJ SOLN
INTRAMUSCULAR | Status: AC
  Filled 2017-11-27: qty 2

## 2017-11-27 MED ORDER — LACTATED RINGERS IV SOLN
INTRAVENOUS | Status: DC | PRN
  Administered 2017-11-27: 08:00:00 via INTRAVENOUS
  Administered 2017-11-27: 1000 mL via INTRAVENOUS
  Administered 2017-11-27: 06:00:00 via INTRAVENOUS

## 2017-11-27 MED ORDER — METHOCARBAMOL 500 MG PO TABS
500.0000 mg | ORAL_TABLET | Freq: Four times a day (QID) | ORAL | Status: DC | PRN
  Administered 2017-11-27: 500 mg via ORAL
  Filled 2017-11-27: qty 1

## 2017-11-27 MED ORDER — DEXAMETHASONE SODIUM PHOSPHATE 10 MG/ML IJ SOLN: 10.0000 mg | Freq: Once | INTRAMUSCULAR | Status: DC

## 2017-11-27 MED ORDER — PHENYLEPHRINE 40 MCG/ML (10ML) SYRINGE FOR IV PUSH (FOR BLOOD PRESSURE SUPPORT)
PREFILLED_SYRINGE | INTRAVENOUS | Status: AC
  Filled 2017-11-27: qty 10

## 2017-11-27 MED ORDER — HYDROCODONE-ACETAMINOPHEN 7.5-325 MG PO TABS
ORAL_TABLET | ORAL | Status: AC
  Administered 2017-11-27: 1
  Filled 2017-11-27: qty 1

## 2017-11-27 MED ORDER — STERILE WATER FOR IRRIGATION IR SOLN
Status: DC | PRN
  Administered 2017-11-27: 2000 mL

## 2017-11-27 MED ORDER — BUPIVACAINE HCL (PF) 0.5 % IJ SOLN
INTRAMUSCULAR | Status: DC | PRN
  Administered 2017-11-27: 3 mL

## 2017-11-27 MED ORDER — ADULT MULTIVITAMIN W/MINERALS CH
1.0000 | ORAL_TABLET | Freq: Every day | ORAL | Status: DC
  Administered 2017-11-28: 1 via ORAL
  Filled 2017-11-27: qty 1

## 2017-11-27 MED ORDER — MAGNESIUM OXIDE 400 (241.3 MG) MG PO TABS
400.0000 mg | ORAL_TABLET | Freq: Every day | ORAL | Status: DC
  Administered 2017-11-28: 400 mg via ORAL
  Filled 2017-11-27: qty 1

## 2017-11-27 MED ORDER — METOCLOPRAMIDE HCL 5 MG/ML IJ SOLN: 5.0000 mg | Freq: Three times a day (TID) | INTRAMUSCULAR | Status: DC | PRN

## 2017-11-27 MED ORDER — METOCLOPRAMIDE HCL 5 MG PO TABS: 5.0000 mg | ORAL_TABLET | Freq: Three times a day (TID) | ORAL | Status: DC | PRN

## 2017-11-27 MED ORDER — PHENOL 1.4 % MT LIQD
1.0000 | OROMUCOSAL | Status: DC | PRN
  Filled 2017-11-27: qty 177

## 2017-11-27 MED ORDER — TRANEXAMIC ACID 1000 MG/10ML IV SOLN
1000.0000 mg | Freq: Once | INTRAVENOUS | Status: AC
  Administered 2017-11-27: 1000 mg via INTRAVENOUS
  Filled 2017-11-27: qty 1100

## 2017-11-27 MED ORDER — FENTANYL CITRATE (PF) 100 MCG/2ML IJ SOLN
INTRAMUSCULAR | Status: AC
  Filled 2017-11-27: qty 2

## 2017-11-27 MED ORDER — PROPOFOL 500 MG/50ML IV EMUL
INTRAVENOUS | Status: DC | PRN
  Administered 2017-11-27: 50 ug/kg/min via INTRAVENOUS

## 2017-11-27 MED ORDER — ASPIRIN 81 MG PO CHEW
81.0000 mg | CHEWABLE_TABLET | Freq: Two times a day (BID) | ORAL | Status: DC
  Administered 2017-11-27 – 2017-11-28 (×2): 81 mg via ORAL
  Filled 2017-11-27 (×2): qty 1

## 2017-11-27 MED ORDER — CEFAZOLIN SODIUM-DEXTROSE 2-4 GM/100ML-% IV SOLN
2.0000 g | INTRAVENOUS | Status: AC
  Administered 2017-11-27: 2 g via INTRAVENOUS

## 2017-11-27 MED ORDER — PHENYLEPHRINE 40 MCG/ML (10ML) SYRINGE FOR IV PUSH (FOR BLOOD PRESSURE SUPPORT)
PREFILLED_SYRINGE | INTRAVENOUS | Status: DC | PRN
  Administered 2017-11-27 (×4): 80 ug via INTRAVENOUS

## 2017-11-27 MED ORDER — FENTANYL CITRATE (PF) 100 MCG/2ML IJ SOLN
25.0000 ug | INTRAMUSCULAR | Status: DC | PRN
Start: 2017-11-27 — End: 2017-11-27

## 2017-11-27 MED ORDER — CAL MAG ZINC +D3 PO TABS: 1.0000 | ORAL_TABLET | Freq: Every day | ORAL | Status: DC

## 2017-11-27 MED ORDER — POLYETHYLENE GLYCOL 3350 17 G PO PACK
17.0000 g | PACK | Freq: Two times a day (BID) | ORAL | Status: DC
  Administered 2017-11-28: 17 g via ORAL
  Filled 2017-11-27: qty 1

## 2017-11-27 MED ORDER — ACETAMINOPHEN 650 MG RE SUPP: 650.0000 mg | RECTAL | Status: DC | PRN

## 2017-11-27 MED ORDER — VITAMIN B-12 1000 MCG PO TABS
1000.0000 ug | ORAL_TABLET | Freq: Every day | ORAL | Status: DC
  Administered 2017-11-28: 1000 ug via ORAL
  Filled 2017-11-27: qty 1

## 2017-11-27 MED ORDER — METHOCARBAMOL 1000 MG/10ML IJ SOLN
500.0000 mg | Freq: Four times a day (QID) | INTRAVENOUS | Status: DC | PRN
  Administered 2017-11-27: 500 mg via INTRAVENOUS
  Filled 2017-11-27: qty 550

## 2017-11-27 MED ORDER — SODIUM CHLORIDE 0.9 % IV SOLN
INTRAVENOUS | Status: DC
Start: 2017-11-27 — End: 2017-11-28
  Administered 2017-11-27 – 2017-11-28 (×2): via INTRAVENOUS

## 2017-11-27 MED ORDER — ONDANSETRON HCL 4 MG/2ML IJ SOLN
INTRAMUSCULAR | Status: AC
  Filled 2017-11-27: qty 4

## 2017-11-27 MED ORDER — MENTHOL 3 MG MT LOZG: 1.0000 | LOZENGE | OROMUCOSAL | Status: DC | PRN

## 2017-11-27 MED ORDER — LIP MEDEX EX OINT
TOPICAL_OINTMENT | CUTANEOUS | Status: AC
  Filled 2017-11-27: qty 7

## 2017-11-27 MED ORDER — DEXAMETHASONE SODIUM PHOSPHATE 10 MG/ML IJ SOLN
10.0000 mg | Freq: Once | INTRAMUSCULAR | Status: AC
  Administered 2017-11-28: 10 mg via INTRAVENOUS
  Filled 2017-11-27: qty 1

## 2017-11-27 MED ORDER — DOCUSATE SODIUM 100 MG PO CAPS
100.0000 mg | ORAL_CAPSULE | Freq: Two times a day (BID) | ORAL | Status: DC
  Administered 2017-11-27 – 2017-11-28 (×2): 100 mg via ORAL
  Filled 2017-11-27 (×2): qty 1

## 2017-11-27 MED ORDER — ONDANSETRON HCL 4 MG/2ML IJ SOLN
INTRAMUSCULAR | Status: DC | PRN
  Administered 2017-11-27: 4 mg via INTRAVENOUS

## 2017-11-27 MED ORDER — CEFAZOLIN SODIUM-DEXTROSE 1-4 GM/50ML-% IV SOLN
1.0000 g | Freq: Four times a day (QID) | INTRAVENOUS | Status: AC
  Administered 2017-11-27 (×2): 1 g via INTRAVENOUS
  Filled 2017-11-27 (×3): qty 50

## 2017-11-27 MED ORDER — PROPOFOL 10 MG/ML IV BOLUS
INTRAVENOUS | Status: AC
  Filled 2017-11-27: qty 60

## 2017-11-27 MED ORDER — HYDROCODONE-ACETAMINOPHEN 7.5-325 MG PO TABS
1.0000 | ORAL_TABLET | Freq: Four times a day (QID) | ORAL | Status: DC
  Administered 2017-11-27: 1 via ORAL
  Administered 2017-11-27: 2 via ORAL
  Administered 2017-11-28: 1 via ORAL
  Filled 2017-11-27: qty 1
  Filled 2017-11-27: qty 2

## 2017-11-27 MED ORDER — LACTATED RINGERS IV SOLN: INTRAVENOUS | Status: DC

## 2017-11-27 MED ORDER — CHLORHEXIDINE GLUCONATE 4 % EX LIQD: 60.0000 mL | Freq: Once | CUTANEOUS | Status: DC

## 2017-11-27 MED ORDER — B-12 1000 MCG PO TBDP: 1000.0000 ug | ORAL_TABLET | Freq: Every day | ORAL | Status: DC

## 2017-11-27 MED ORDER — LEVOTHYROXINE SODIUM 75 MCG PO TABS
75.0000 ug | ORAL_TABLET | Freq: Every day | ORAL | Status: DC
  Administered 2017-11-28: 75 ug via ORAL
  Filled 2017-11-27: qty 1

## 2017-11-27 MED ORDER — HYDROMORPHONE HCL 1 MG/ML IJ SOLN
0.5000 mg | INTRAMUSCULAR | Status: DC | PRN
  Administered 2017-11-27: 0.5 mg via INTRAVENOUS

## 2017-11-27 MED ORDER — TRANEXAMIC ACID 1000 MG/10ML IV SOLN
1000.0000 mg | INTRAVENOUS | Status: AC
  Administered 2017-11-27: 1000 mg via INTRAVENOUS
  Filled 2017-11-27: qty 1100

## 2017-11-27 MED ORDER — ALUM & MAG HYDROXIDE-SIMETH 200-200-20 MG/5ML PO SUSP: 30.0000 mL | ORAL | Status: DC | PRN

## 2017-11-27 MED ORDER — ONDANSETRON HCL 4 MG/2ML IJ SOLN: 4.0000 mg | Freq: Four times a day (QID) | INTRAMUSCULAR | Status: DC | PRN

## 2017-11-27 MED ORDER — EPHEDRINE SULFATE-NACL 50-0.9 MG/10ML-% IV SOSY
PREFILLED_SYRINGE | INTRAVENOUS | Status: DC | PRN
  Administered 2017-11-27: 5 mg via INTRAVENOUS

## 2017-11-27 MED ORDER — FENTANYL CITRATE (PF) 100 MCG/2ML IJ SOLN
INTRAMUSCULAR | Status: DC | PRN
  Administered 2017-11-27 (×2): 50 ug via INTRAVENOUS

## 2017-11-27 SURGICAL SUPPLY — 36 items
BAG DECANTER FOR FLEXI CONT (MISCELLANEOUS) IMPLANT
BAG ZIPLOCK 12X15 (MISCELLANEOUS) IMPLANT
BLADE SAG 18X100X1.27 (BLADE) ×2 IMPLANT
CAPT HIP TOTAL 2 ×2 IMPLANT
CLOTH BEACON ORANGE TIMEOUT ST (SAFETY) ×2 IMPLANT
COVER PERINEAL POST (MISCELLANEOUS) ×2 IMPLANT
COVER SURGICAL LIGHT HANDLE (MISCELLANEOUS) ×2 IMPLANT
DERMABOND ADVANCED (GAUZE/BANDAGES/DRESSINGS) ×1
DERMABOND ADVANCED .7 DNX12 (GAUZE/BANDAGES/DRESSINGS) ×1 IMPLANT
DRAPE STERI IOBAN 125X83 (DRAPES) ×2 IMPLANT
DRAPE U-SHAPE 47X51 STRL (DRAPES) ×4 IMPLANT
DRESSING AQUACEL AG SP 3.5X10 (GAUZE/BANDAGES/DRESSINGS) ×1 IMPLANT
DRSG AQUACEL AG SP 3.5X10 (GAUZE/BANDAGES/DRESSINGS) ×2
DURAPREP 26ML APPLICATOR (WOUND CARE) ×2 IMPLANT
ELECT REM PT RETURN 15FT ADLT (MISCELLANEOUS) ×2 IMPLANT
GLOVE BIOGEL M STRL SZ7.5 (GLOVE) IMPLANT
GLOVE BIOGEL PI IND STRL 7.5 (GLOVE) ×1 IMPLANT
GLOVE BIOGEL PI IND STRL 8.5 (GLOVE) ×1 IMPLANT
GLOVE BIOGEL PI INDICATOR 7.5 (GLOVE) ×1
GLOVE BIOGEL PI INDICATOR 8.5 (GLOVE) ×1
GLOVE ECLIPSE 8.0 STRL XLNG CF (GLOVE) ×4 IMPLANT
GLOVE ORTHO TXT STRL SZ7.5 (GLOVE) ×2 IMPLANT
GOWN STRL REUS W/TWL LRG LVL3 (GOWN DISPOSABLE) ×2 IMPLANT
GOWN STRL REUS W/TWL XL LVL3 (GOWN DISPOSABLE) ×2 IMPLANT
HOLDER FOLEY CATH W/STRAP (MISCELLANEOUS) ×2 IMPLANT
PACK ANTERIOR HIP CUSTOM (KITS) ×2 IMPLANT
SUT MNCRL AB 4-0 PS2 18 (SUTURE) ×2 IMPLANT
SUT STRATAFIX 0 PDS 27 VIOLET (SUTURE) ×2
SUT VIC AB 1 CT1 36 (SUTURE) ×6 IMPLANT
SUT VIC AB 2-0 CT1 27 (SUTURE) ×2
SUT VIC AB 2-0 CT1 TAPERPNT 27 (SUTURE) ×2 IMPLANT
SUTURE STRATFX 0 PDS 27 VIOLET (SUTURE) ×1 IMPLANT
TRAY FOLEY CATH 14FRSI W/METER (CATHETERS) ×2 IMPLANT
TRAY FOLEY W/METER SILVER 16FR (SET/KITS/TRAYS/PACK) IMPLANT
WATER STERILE IRR 1000ML POUR (IV SOLUTION) ×2 IMPLANT
YANKAUER SUCT BULB TIP 10FT TU (MISCELLANEOUS) IMPLANT

## 2017-11-27 NOTE — Op Note (Signed)
NAME:  [facility]                ACCOUNT NO.: [id number]      MEDICAL RECORD NO.: [id number]      FACILITY:  [facility]      PHYSICIAN:  Shelda Pal  DATE OF BIRTH:  11-30-1951     DATE OF PROCEDURE:  11/27/2017                                 OPERATIVE REPORT         PREOPERATIVE DIAGNOSIS: Right  hip osteoarthritis.      POSTOPERATIVE DIAGNOSIS:  Right hip osteoarthritis.      PROCEDURE:  Right total hip replacement through an anterior approach   utilizing DePuy THR system, component size 50mm pinnacle cup, a size 32+4 neutral   Altrex liner, a size 1Hi Tri Lock stem with a 32+1 delta ceramic   ball.      SURGEON:  Madlyn Frankel. Charlann Boxer, M.D.      ASSISTANT:  Leilani Able, PA-C     ANESTHESIA:  Spinal.      SPECIMENS:  None.      COMPLICATIONS:  None.      BLOOD LOSS:  250 cc     DRAINS:  None.      INDICATION OF THE PROCEDURE:  Brittney Salinas is a 66 y.o. female who had   presented to office for evaluation of right hip pain.  Radiographs revealed   progressive degenerative changes with bone-on-bone   articulation to the  hip joint.  The patient had painful limited range of   motion significantly affecting their overall quality of life.  The patient was failing to    respond to conservative measures, and at this point was ready   to proceed with more definitive measures.  The patient has noted progressive   degenerative changes in his hip, progressive problems and dysfunction   with regarding the hip prior to surgery.  Consent was obtained for   benefit of pain relief.  Specific risk of infection, DVT, component   failure, dislocation, need for revision surgery, as well discussion of   the anterior versus posterior approach were reviewed.  Consent was   obtained for benefit of anterior pain relief through an anterior   approach.      PROCEDURE IN DETAIL:  The patient was brought to operative theater.   Once adequate  anesthesia, preoperative antibiotics, 2 gm of Ancef, 1 gm of Tranexamic Acid, and 10 mg of Decadron administered.   The patient was positioned supine on the OSI Hanna table.  Once adequate   padding of boney process was carried out, we had predraped out the hip, and  used fluoroscopy to confirm orientation of the pelvis and position.      The right hip was then prepped and draped from proximal iliac crest to   mid thigh with shower curtain technique.      Time-out was performed identifying the patient, planned procedure, and   extremity.     An incision was then made 2 cm distal and lateral to the   anterior superior iliac spine extending over the orientation of the   tensor fascia lata muscle and sharp dissection was carried down to the   fascia of the muscle and protractor placed in the soft tissues.      The fascia was then  incised.  The muscle belly was identified and swept   laterally and retractor placed along the superior neck.  Following   cauterization of the circumflex vessels and removing some pericapsular   fat, a second cobra retractor was placed on the inferior neck.  A third   retractor was placed on the anterior acetabulum after elevating the   anterior rectus.  A L-capsulotomy was along the line of the   superior neck to the trochanteric fossa, then extended proximally and   distally.  Tag sutures were placed and the retractors were then placed   intracapsular.  We then identified the trochanteric fossa and   orientation of my neck cut, confirmed this radiographically   and then made a neck osteotomy with the femur on traction.  The femoral   head was removed without difficulty or complication.  Traction was let   off and retractors were placed posterior and anterior around the   acetabulum.      The labrum and foveal tissue were debrided.  I began reaming with a 44mm   reamer and reamed up to 49mm reamer with good bony bed preparation and a 50mm   cup was chosen.   The final 50mm Pinnacle cup was then impacted under fluoroscopy  to confirm the depth of penetration and orientation with respect to   abduction.  A screw was placed followed by the hole eliminator.  The final   32+4 neutral Altrex liner was impacted with good visualized rim fit.  The cup was positioned anatomically within the acetabular portion of the pelvis.      At this point, the femur was rolled at 80 degrees.  Further capsule was   released off the inferior aspect of the femoral neck.  I then   released the superior capsule proximally.  The hook was placed laterally   along the femur and elevated manually and held in position with the bed   hook.  The leg was then extended and adducted with the leg rolled to 100   degrees of external rotation.  Once the proximal femur was fully   exposed, I used a box osteotome to set orientation.  I then began   broaching with the starting chili pepper broach and passed this by hand and then broached up to 1.  With the 1 broach in place I chose a high offset neck and did several trial reductions.  The offset was appropriate, leg lengths   appeared to be equal seemingly best matched with the +1 versus the +5 head ball confirmed radiographically.   Given these findings, I went ahead and dislocated the hip, repositioned all   retractors and positioned the right hip in the extended and abducted position.  The final 1 Hi Tri Lock stem was   chosen and it was impacted down to the level of neck cut.  Based on this   and the trial reduction, a 32+1 delta ceramic ball was chosen and   impacted onto a clean and dry trunnion, and the hip was reduced.  The   hip had been irrigated throughout the case again at this point.  I did   reapproximate the superior capsular leaflet to the anterior leaflet   using #1 Vicryl.  The fascia of the   tensor fascia lata muscle was then reapproximated using #1 Vicryl and #0 Stratafix sutures.  The   remaining wound was closed with  2-0 Vicryl and running 4-0 Monocryl.   The hip was  cleaned, dried, and dressed sterilely using Dermabond and   Aquacel dressing.  She was then brought   to recovery room in stable condition tolerating the procedure well.    Leilani AbleSteve Chabon, PA-C was present for the entirety of the case involved from   preoperative positioning, perioperative retractor management, general   facilitation of the case, as well as primary wound closure as assistant.            Madlyn FrankelMatthew D. Charlann Boxerlin, M.D.        11/27/2017 8:26 AM

## 2017-11-27 NOTE — Anesthesia Procedure Notes (Signed)
Spinal  Patient location during procedure: OR Staffing Anesthesiologist: Cristela BlueJackson, Kyle, MD Spinal Block Patient position: sitting Prep: DuraPrep Patient monitoring: heart rate, blood pressure and continuous pulse ox Approach: right paramedian Location: L3-4 Injection technique: single-shot Needle Needle type: Sprotte  Needle gauge: 24 G Needle length: 9 cm Assessment Sensory level: T4 Additional Notes Spinal Dosage in OR  .5% Bupivicaine ml       2.8

## 2017-11-27 NOTE — Interval H&P Note (Signed)
History and Physical Interval Note:  11/27/2017 6:47 AM  Brittney Salinas  has presented today for surgery, with the diagnosis of Right hip osteoarthritis  The various methods of treatment have been discussed with the patient and family. After consideration of risks, benefits and other options for treatment, the patient has consented to  Procedure(s) with comments: RIGHT TOTAL HIP ARTHROPLASTY ANTERIOR APPROACH (Right) - 70 mins as a surgical intervention .  The patient's history has been reviewed, patient examined, no change in status, stable for surgery.  I have reviewed the patient's chart and labs.  Questions were answered to the patient's satisfaction.     Shelda PalMatthew D Olin

## 2017-11-27 NOTE — Anesthesia Postprocedure Evaluation (Signed)
Anesthesia Post Note  Patient: Brittney Salinas  Procedure(s) Performed: RIGHT TOTAL HIP ARTHROPLASTY ANTERIOR APPROACH (Right Hip)     Patient location during evaluation: PACU Anesthesia Type: Spinal Level of consciousness: awake Pain management: satisfactory to patient Vital Signs Assessment: post-procedure vital signs reviewed and stable Respiratory status: spontaneous breathing Cardiovascular status: blood pressure returned to baseline Postop Assessment: no headache and spinal receding Anesthetic complications: no    Last Vitals:  Vitals:   11/27/17 1500 11/27/17 1602  BP: 123/66 (!) 137/58  Pulse: 76 60  Resp: 16 16  Temp: 36.7 C 36.8 C  SpO2: 100% 100%    Last Pain:  Vitals:   11/27/17 1500  TempSrc:   PainSc: Asleep                 JACKSON,KYLE EDWARD

## 2017-11-27 NOTE — Evaluation (Signed)
Physical Therapy Evaluation Patient Details Name: Brittney Salinas MRN: [id number] DOB: 03/19/1951 Today's Date: 11/27/2017   History of Present Illness  66 yo female s/p R THA-direct anterior 11/27/17  Clinical Impression  On eval POD 0, pt required Min assist for mobility. She walked ~50 feet with a RW. Pain rated 6/10 with activity. Will follow and progress activity as tolerated.     Follow Up Recommendations DC plan and follow up therapy as arranged by surgeon    Equipment Recommendations  None recommended by PT    Recommendations for Other Services       Precautions / Restrictions Precautions Precautions: Fall Restrictions Weight Bearing Restrictions: No RLE Weight Bearing: Weight bearing as tolerated      Mobility  Bed Mobility Overal bed mobility: Needs Assistance Bed Mobility: Supine to Sit     Supine to sit: Min assist;HOB elevated     General bed mobility comments: Assist for R LE. Increased time. VCs technique.   Transfers Overall transfer level: Needs assistance Equipment used: Rolling walker (2 wheeled) Transfers: Sit to/from Stand Sit to Stand: Min assist         General transfer comment: Assist to rise, stabilize, control descent. VCs safety, technique, hand/LE placement.   Ambulation/Gait Ambulation/Gait assistance: Min assist Ambulation Distance (Feet): 50 Feet Assistive device: Rolling walker (2 wheeled) Gait Pattern/deviations: Step-to pattern     General Gait Details: Assist to stabilize. Slow gait speed. VCs safety, sequence.   Stairs            Wheelchair Mobility    Modified Rankin (Stroke Patients Only)       Balance                                             Pertinent Vitals/Pain Pain Assessment: 0-10 Pain Score: 6  Pain Location: R thigh Pain Descriptors / Indicators: Burning;Sore Pain Intervention(s): Monitored during session;Repositioned    Home Living Family/patient expects to  be discharged to:: Private residence Living Arrangements: Spouse/significant other   Type of Home: House Home Access: Stairs to enter Entrance Stairs-Rails: None Entrance Stairs-Number of Steps: 3 Home Layout: Able to live on main level with bedroom/bathroom;Laundry or work area in [employer]ement Home Equipment: Environmental consultantWalker - 2 wheels      Prior Function Level of Independence: Independent               Hand Dominance        Extremity/Trunk Assessment   Upper Extremity Assessment Upper Extremity Assessment: Overall WFL for tasks assessed    Lower Extremity Assessment Lower Extremity Assessment: Generalized weakness(s/p R THA)    Cervical / Trunk Assessment Cervical / Trunk Assessment: Normal  Communication   Communication: No difficulties  Cognition Arousal/Alertness: Awake/alert Behavior During Therapy: WFL for tasks assessed/performed Overall Cognitive Status: Within Functional Limits for tasks assessed                                        General Comments      Exercises     Assessment/Plan    PT Assessment Patient needs continued PT services  PT Problem List Decreased strength;Decreased mobility;Decreased range of motion;Decreased activity tolerance;Decreased balance;Decreased knowledge of use of DME;Pain       PT Treatment Interventions DME instruction;Gait training;Therapeutic activities;Therapeutic exercise;Patient/family  education;Functional mobility training;Stair training    PT Goals (Current goals can be found in the Care Plan section)  Acute Rehab PT Goals Patient Stated Goal: regain PLOF and independence PT Goal Formulation: With patient Time For Goal Achievement: 12/11/17 Potential to Achieve Goals: Good    Frequency 7X/week   Barriers to discharge        Co-evaluation               AM-PAC PT "6 Clicks" Daily Activity  Outcome Measure Difficulty turning over in bed (including adjusting bedclothes, sheets and  blankets)?: Unable Difficulty moving from lying on back to sitting on the side of the bed? : Unable Difficulty sitting down on and standing up from a chair with arms (e.g., wheelchair, bedside commode, etc,.)?: Unable Help needed moving to and from a bed to chair (including a wheelchair)?: A Little Help needed walking in hospital room?: A Little Help needed climbing 3-5 steps with a railing? : A Little 6 Click Score: 12    End of Session Equipment Utilized During Treatment: Gait belt Activity Tolerance: Patient tolerated treatment well Patient left: in chair;with call bell/phone within reach;with family/visitor present   PT Visit Diagnosis: Muscle weakness (generalized) (M62.81);Difficulty in walking, not elsewhere classified (R26.2)    Time: 1610-96041708-1724 PT Time Calculation (min) (ACUTE ONLY): 16 min   Charges:   PT Evaluation $PT Eval Low Complexity: 1 Low     PT G Codes:         Rebeca AlertJannie Porter, MPT Pager: [phone]

## 2017-11-27 NOTE — Transfer of Care (Signed)
Immediate Anesthesia Transfer of Care Note  Patient: Brittney Salinas  Procedure(s) Performed: RIGHT TOTAL HIP ARTHROPLASTY ANTERIOR APPROACH (Right Hip)  Patient Location: PACU  Anesthesia Type:Spinal  Level of Consciousness: awake, alert  and oriented  Airway & Oxygen Therapy: Patient Spontanous Breathing and Patient connected to face mask oxygen  Post-op Assessment: Report given to RN  Post vital signs: Reviewed and stable  Last Vitals:  Vitals:   11/27/17 0547 11/27/17 0555  BP: 134/78   Pulse: 81 81  Resp:  16  Temp:  36.9 C  SpO2: 95% 96%    Last Pain:  Vitals:   11/27/17 0555  TempSrc:   PainSc: 0-No pain         Complications: No apparent anesthesia complications

## 2017-11-28 ENCOUNTER — Encounter (HOSPITAL_COMMUNITY): Payer: Self-pay | Admitting: Orthopedic Surgery

## 2017-11-28 LAB — CBC
HCT: 34 % — ABNORMAL LOW (ref 36.0–46.0)
Hemoglobin: 11.1 g/dL — ABNORMAL LOW (ref 12.0–15.0)
MCH: 29.3 pg (ref 26.0–34.0)
MCHC: 32.6 g/dL (ref 30.0–36.0)
MCV: 89.7 fL (ref 78.0–100.0)
Platelets: 201 10*3/uL (ref 150–400)
RBC: 3.79 MIL/uL — ABNORMAL LOW (ref 3.87–5.11)
RDW: 13 % (ref 11.5–15.5)
WBC: 12.8 10*3/uL — ABNORMAL HIGH (ref 4.0–10.5)

## 2017-11-28 LAB — BASIC METABOLIC PANEL
Anion gap: 5 (ref 5–15)
BUN: 10 mg/dL (ref 6–20)
CO2: 24 mmol/L (ref 22–32)
Calcium: 9 mg/dL (ref 8.9–10.3)
Chloride: 108 mmol/L (ref 101–111)
Creatinine, Ser: 0.59 mg/dL (ref 0.44–1.00)
GFR calc Af Amer: >60 mL/min (ref >60)
GFR calc non Af Amer: >60 mL/min (ref >60)
Glucose, Bld: 111 mg/dL — ABNORMAL HIGH (ref 65–99)
Potassium: 4.1 mmol/L (ref 3.5–5.1)
Sodium: 137 mmol/L (ref 135–145)

## 2017-11-28 MED ORDER — HYDROCODONE-ACETAMINOPHEN 7.5-325 MG PO TABS: 1.0000 | ORAL_TABLET | Freq: Four times a day (QID) | ORAL | 0 refills | Status: AC | PRN

## 2017-11-28 MED ORDER — FERROUS SULFATE 325 (65 FE) MG PO TABS: 325.0000 mg | ORAL_TABLET | Freq: Two times a day (BID) | ORAL | 0 refills | Status: AC

## 2017-11-28 MED ORDER — ASPIRIN 81 MG PO CHEW: 81.0000 mg | CHEWABLE_TABLET | Freq: Two times a day (BID) | ORAL | 0 refills | Status: AC

## 2017-11-28 MED ORDER — METHOCARBAMOL 500 MG PO TABS: 500.0000 mg | ORAL_TABLET | Freq: Four times a day (QID) | ORAL | 0 refills | Status: AC | PRN

## 2017-11-28 MED ORDER — POLYETHYLENE GLYCOL 3350 17 G PO PACK: 17.0000 g | PACK | Freq: Two times a day (BID) | ORAL | 0 refills | Status: AC

## 2017-11-28 MED ORDER — DOCUSATE SODIUM 100 MG PO CAPS: 100.0000 mg | ORAL_CAPSULE | Freq: Two times a day (BID) | ORAL | 0 refills | Status: AC

## 2017-11-28 NOTE — Progress Notes (Signed)
Patient ID: Brittney Salinas, female   DOB: 12-Feb-1951, 66 y.o.   MRN: [id number] Subjective: 1 Day Post-Op Procedure(s) (LRB): RIGHT TOTAL HIP ARTHROPLASTY ANTERIOR APPROACH (Right)    Patient reports pain as mild. Up eating breakfast this am.  Comfortable, no events overnight  Objective:   VITALS:   Vitals:   11/28/17 0608 11/28/17 1045  BP: 109/66 111/74  Pulse: (!) 55 (!) 59  Resp: 17 15  Temp: 97.9 F (36.6 C) 98 F (36.7 C)  SpO2: 98% 99%    Neurovascular intact Incision: dressing C/D/I  LABS Recent Labs    11/28/17 0613  HGB 11.1*  HCT 34.0*  WBC 12.8*  PLT 201    Recent Labs    11/28/17 0613  NA 137  K 4.1  BUN 10  CREATININE 0.59  GLUCOSE 111*    No results for input(s): LABPT, INR in the last 72 hours.   Assessment/Plan: 1 Day Post-Op Procedure(s) (LRB): RIGHT TOTAL HIP ARTHROPLASTY ANTERIOR APPROACH (Right)   Advance diet Up with therapy  Plan to be D/C'd to home today RTC in 2 weeks Reviewed goals and activity, wound care

## 2017-11-28 NOTE — Progress Notes (Signed)
Patient discharged to home w/ family. Given all belongings, instructions, prescriptions, equipment. Verbalized understanding of all instructions. Escorted to POV via w/c. 

## 2017-11-28 NOTE — Discharge Instructions (Signed)

## 2017-11-28 NOTE — Progress Notes (Signed)
Physical Therapy Treatment Patient Details Name: Brittney Salinas MRN: [id number] DOB: 04-16-1951 Today's Date: 11/28/2017    History of Present Illness [age] yo female s/p R THA-direct anterior 11/27/17    PT Comments    Progressing well with mobility. Reviewed exercises, gait training, and stair training. Issued HEP for pt to perform 2x/day. All education completed. Okay to d/c from PT standpoint-made RN aware.    Follow Up Recommendations  DC plan and follow up therapy as arranged by surgeon     Equipment Recommendations  None recommended by PT    Recommendations for Other Services       Precautions / Restrictions Precautions Precautions: Fall Restrictions Weight Bearing Restrictions: No RLE Weight Bearing: Weight bearing as tolerated    Mobility  Bed Mobility Overal bed mobility: Needs Assistance Bed Mobility: Supine to Sit     Supine to sit: Min assist;HOB elevated     General bed mobility comments: small amount of assist for R LE. Increased time. VCs technique.   Transfers Overall transfer level: Needs assistance Equipment used: Rolling walker (2 wheeled) Transfers: Sit to/from Stand Sit to Stand: Min guard         General transfer comment: Close guard for safety. VCs safety, hand placement  Ambulation/Gait Ambulation/Gait assistance: Min guard Ambulation Distance (Feet): 150 Feet Assistive device: Rolling walker (2 wheeled) Gait Pattern/deviations: Step-to pattern;Step-through pattern;Decreased stride length     General Gait Details: close guard for safety.    Stairs Stairs: Yes Min guard Assist Stair Management: Step to pattern;One rail Left;One rail Right;Forwards Number of Stairs: 4 General stair comments: Practiced x 1 with 1 rail, 1 HHA to simulate entry into home. Then practiced a 2nd time with 1 rail to simulate stairs down to basement.  Wheelchair Mobility    Modified Rankin (Stroke Patients Only)       Balance                                             Cognition Arousal/Alertness: Awake/alert Behavior During Therapy: WFL for tasks assessed/performed Overall Cognitive Status: Within Functional Limits for tasks assessed                                        Exercises Total Joint Exercises Ankle Circles/Pumps: AROM;10 reps;Supine Quad Sets: AROM;Both;10 reps;Supine Heel Slides: AAROM;Right;10 reps;Supine Hip ABduction/ADduction: AAROM;Right;10 reps;Supine Long Arc Quad: AROM;Right;10 reps;Seated Knee Flexion: AROM;Right;10 reps;Standing Marching in Standing: AROM;10 reps;Standing General Exercises - Lower Extremity Heel Raises: AROM;Both;10 reps;Standing    General Comments        Pertinent Vitals/Pain Pain Assessment: 0-10 Pain Score: 5  Pain Location: R thigh Pain Descriptors / Indicators: Burning;Sore Pain Intervention(s): Monitored during session;Repositioned;Ice applied    Home Living                      Prior Function            PT Goals (current goals can now be found in the care plan section) Progress towards PT goals: Progressing toward goals    Frequency    7X/week      PT Plan Current plan remains appropriate    Co-evaluation              AM-PAC PT "6 Clicks" Daily  Activity  Outcome Measure  Difficulty turning over in bed (including adjusting bedclothes, sheets and blankets)?: Unable Difficulty moving from lying on back to sitting on the side of the bed? : Unable Difficulty sitting down on and standing up from a chair with arms (e.g., wheelchair, bedside commode, etc,.)?: Unable Help needed moving to and from a bed to chair (including a wheelchair)?: A Little Help needed walking in hospital room?: A Little Help needed climbing 3-5 steps with a railing? : A Little 6 Click Score: 12    End of Session Equipment Utilized During Treatment: Gait belt Activity Tolerance: Patient tolerated treatment well Patient  left: in chair;with call bell/phone within reach;with family/visitor present   PT Visit Diagnosis: Muscle weakness (generalized) (M62.81);Difficulty in walking, not elsewhere classified (R26.2)     Time: [id number] PT Time Calculation (min) (ACUTE ONLY): 32 min  Charges:  $Gait Training: 8-22 mins $Therapeutic Exercise: 8-22 mins                    G Codes:          Rebeca AlertJannie Cevin Rubinstein, MPT Pager: [phone]

## 2017-12-06 NOTE — Discharge Summary (Signed)
Physician Discharge Summary  Patient ID: Brittney MurphyVictoria Salinas MRN: [id number] DOB/AGE: 1951-04-29 66 y.o.  Admit date: 11/27/2017 Discharge date: 11/28/2017   Procedures:  Procedure(s) (LRB): RIGHT TOTAL HIP ARTHROPLASTY ANTERIOR APPROACH (Right)  Attending Physician:  Dr. Durene RomansMatthew Olin   Admission Diagnoses:   Right hip primary OA / pain  Discharge Diagnoses:  Active Problems:   H/O total hip arthroplasty   Status post total hip replacement, right  Past Medical History:  Diagnosis Date  . Arthritis   . Breast mass, right    benign  . Family history of adverse reaction to anesthesia    DAD AND MOM HAVE NASEAU AND VOMITING  . Hypothyroidism     HPI:    Brittney Salinas, 66 y.o. female, has a history of pain and functional disability in the right hip(s) due to arthritis and patient has failed non-surgical conservative treatments for greater than 12 weeks to include NSAID's and/or analgesics, corticosteriod injections and activity modification.  Onset of symptoms was gradual starting 1+ years ago with gradually worsening course since that time.The patient noted no past surgery on the right hip(s).  Patient currently rates pain in the right hip at 7 out of 10 with activity. Patient has night pain, worsening of pain with activity and weight bearing, trendelenberg gait, pain that interfers with activities of daily living and pain with passive range of motion. Patient has evidence of periarticular osteophytes and joint space narrowing by imaging studies. This condition presents safety issues increasing the risk of falls.  There is no current active infection.   Risks, benefits and expectations were discussed with the patient.  Risks including but not limited to the risk of anesthesia, blood clots, nerve damage, blood vessel damage, failure of the prosthesis, infection and up to and including death.  Patient understand the risks, benefits and expectations and wishes to proceed with  surgery.  PCP: Albertina SenegalPollock, Nelson, MD   Discharged Condition: good  Hospital Course:  Patient underwent the above stated procedure on 11/27/2017. Patient tolerated the procedure well and brought to the recovery room in good condition and subsequently to the floor.  POD #1 BP: 111/74 ; Pulse: 59 ; Temp: 98 F (36.7 C) ; Resp: 15 Patient reports pain as mild. Up eating breakfast this am.  Comfortable, no events overnight. Neurovascular intact and incision: dressing C/D/I.   LABS  Basename    HGB     11.1  HCT     34.0    Discharge Exam: General appearance: alert, cooperative and no distress Extremities: Homans sign is negative, no sign of DVT, no edema, redness or tenderness in the calves or thighs and no ulcers, gangrene or trophic changes  Disposition: Home with follow up in 2 weeks   Follow-up Information    Durene Romanslin, Matthew, MD Follow up in 2 week(s).   Specialty:  Orthopedic Surgery Contact information: [address] [place] [address] [phone]           Discharge Instructions    Call MD / Call 911   Complete by:  As directed    If you experience chest pain or shortness of breath, CALL 911 and be transported to the hospital emergency room.  If you develope a fever above 101 F, pus (white drainage) or increased drainage or redness at the wound, or calf pain, call your surgeon's office.   Constipation Prevention   Complete by:  As directed    Drink plenty of fluids.  Prune juice may be  helpful.  You may use a stool softener, such as Colace (over the counter) 100 mg twice a day.  Use MiraLax (over the counter) for constipation as needed.   Diet - low sodium heart healthy   Complete by:  As directed    Increase activity slowly as tolerated   Complete by:  As directed       Allergies as of 11/28/2017   No Known Allergies     Medication List    STOP taking these medications   Glucosamine Chondroitin MSM Tabs     TAKE these medications     aspirin 81 MG chewable tablet Chew 1 tablet (81 mg total) by mouth 2 (two) times daily.   B-12 1000 MCG Tbdp Take 1,000 mcg by mouth daily.   CAL MAG ZINC +D3 Tabs Take 1 tablet by mouth daily.   docusate sodium 100 MG capsule Commonly known as:  COLACE Take 1 capsule (100 mg total) by mouth 2 (two) times daily.   ferrous sulfate 325 (65 FE) MG tablet Take 1 tablet (325 mg total) by mouth 2 (two) times daily with a meal.   HAIR/SKIN/NAILS Tabs Take 1-2 tablets by mouth See admin instructions. Take 2 tablets in the morning and 1 tablet in the afternoon   HYDROcodone-acetaminophen 7.5-325 MG tablet Commonly known as:  NORCO Take 1-2 tablets by mouth every 6 (six) hours as needed for moderate pain.   levothyroxine 75 MCG tablet Commonly known as:  SYNTHROID, LEVOTHROID Take 75 mcg by mouth daily before breakfast.   Magnesium 400 MG Tabs Take 400 mg by mouth daily.   methocarbamol 500 MG tablet Commonly known as:  ROBAXIN Take 1 tablet (500 mg total) by mouth every 6 (six) hours as needed for muscle spasms.   multivitamin with minerals Tabs tablet Take 1 tablet by mouth daily.   polyethylene glycol packet Commonly known as:  MIRALAX / GLYCOLAX Take 17 g by mouth 2 (two) times daily.   Vitamin D3 5000 units Caps Take 5,000 Units by mouth daily.        Signed: Anastasio AuerbachMatthew S. Babish   PA-C  12/06/2017, 5:03 PM

## 2018-04-28 IMAGING — MG BREAST SURGICAL SPECIMEN
1 series · 1 of 1 positions shown · non-contrast
Comparison: Previous exam(s).

CLINICAL DATA: Post right breast excision.

EXAM:
SPECIMEN RADIOGRAPH OF THE RIGHT BREAST

[R]
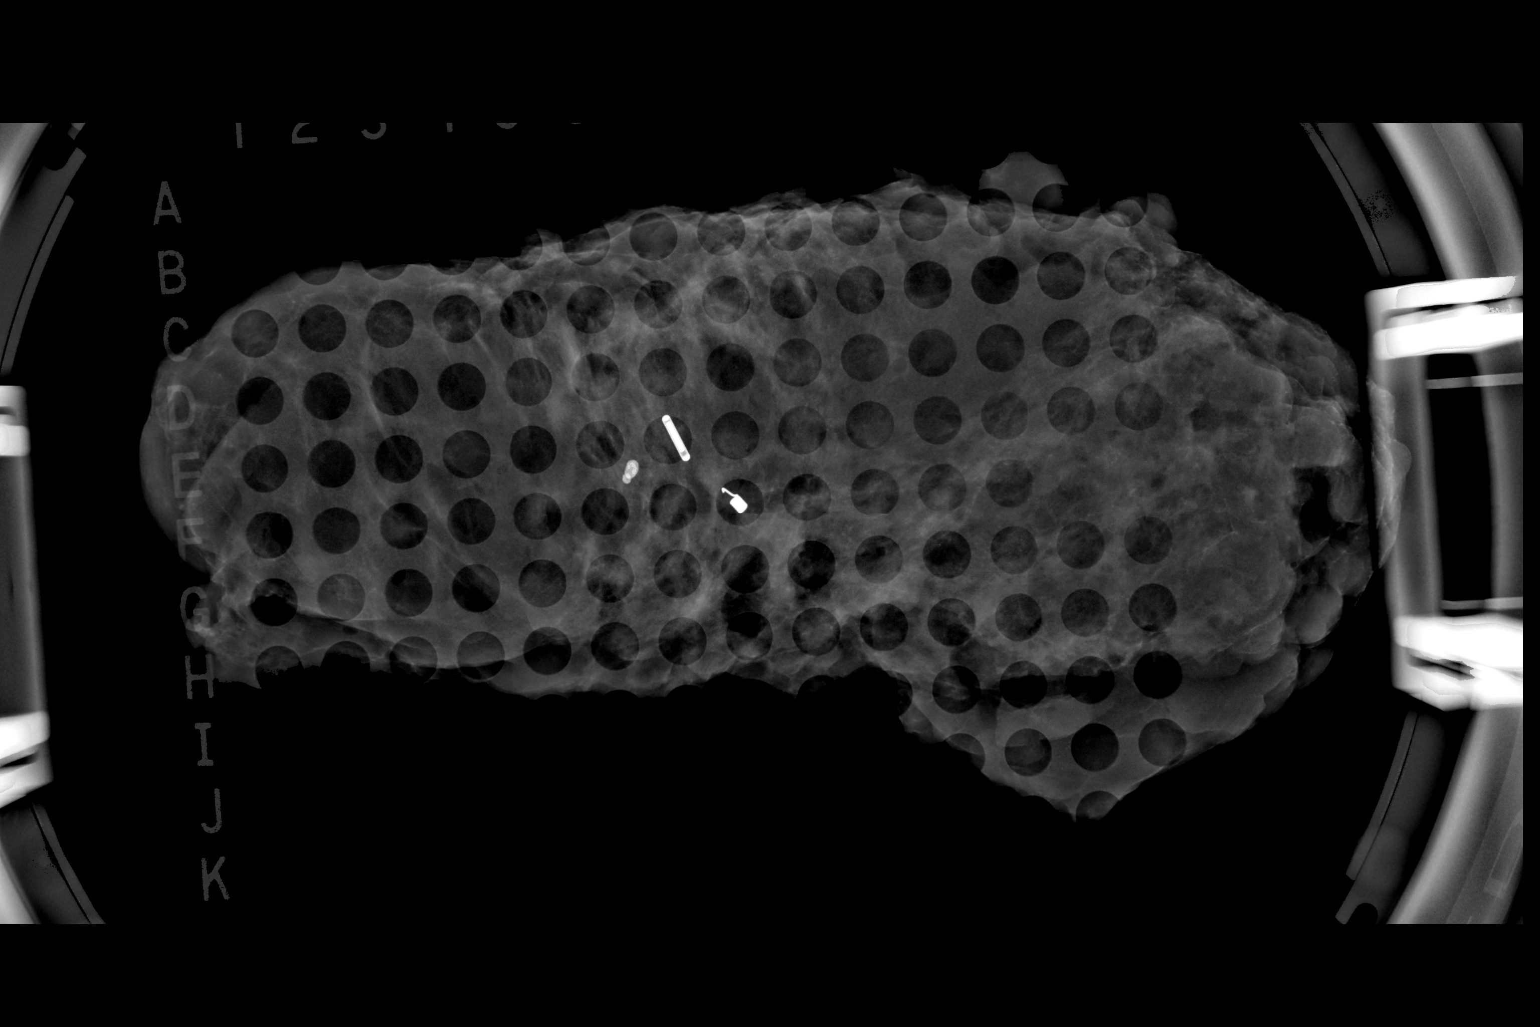

[1 of 1 positions shown; findings below may reference images not displayed]

FINDINGS: Status post excision of the right breast. The radioactive seed and
biopsy marker clip are present, completely intact, and were marked
for pathology.
IMPRESSION: Specimen radiograph of the right breast.

## 2018-11-10 IMAGING — DX DG PORTABLE PELVIS
1 series · 1 of 1 positions shown · non-contrast
Comparison: None.

CLINICAL DATA: Status post right hip arthroplasty

EXAM:
PORTABLE PELVIS 1-2 VIEWS

[pelvis ap]
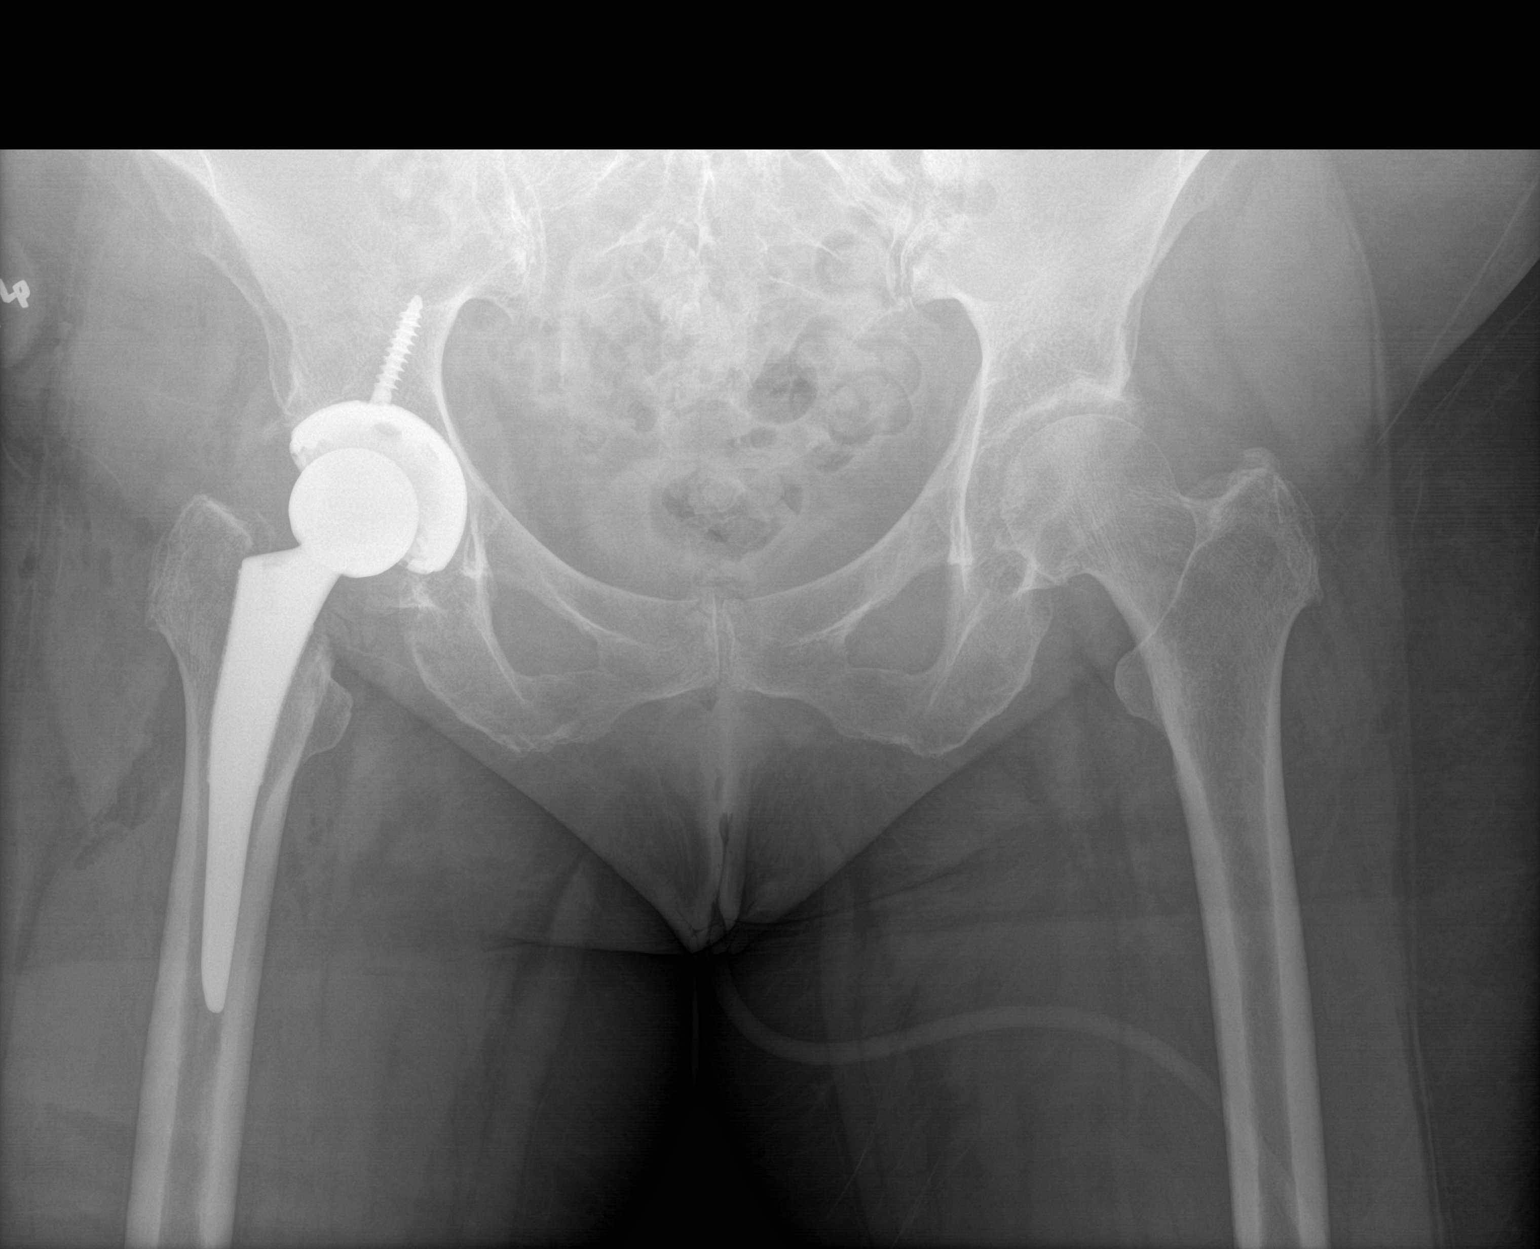

[1 of 1 positions shown; findings below may reference images not displayed]

FINDINGS: Total right hip arthroplasty without failure or complication.
Postsurgical changes in the surrounding soft tissues.

No acute fracture or dislocation.
IMPRESSION: Total right hip arthroplasty without failure or complication.

## 2020-01-18 ENCOUNTER — Ambulatory Visit: Payer: Medicare PPO | Attending: Internal Medicine

## 2020-01-18 DIAGNOSIS — Z23 Encounter for immunization: Secondary | ICD-10-CM

## 2020-01-18 NOTE — Progress Notes (Signed)
   Covid-19 Vaccination Clinic  Name:  Galilea Quito    MRN: [id number] DOB: 05/24/1951  01/18/2020  Ms. White-Lawrence was observed post Covid-19 immunization for 15 minutes without incidence. She was provided with Vaccine Information Sheet and instruction to access the V-Safe system.   Ms. Crotty was instructed to call 911 with any severe reactions post vaccine: Marland Kitchen Difficulty breathing  . Swelling of your face and throat  . A fast heartbeat  . A bad rash all over your body  . Dizziness and weakness    Immunizations Administered    Name Date Dose VIS Date Route   Pfizer COVID-19 Vaccine 01/18/2020  2:11 PM 0.3 mL 12/05/2019 Intramuscular   Manufacturer: [employer], [employer]   Lot: [id number]   NDC: [address]-7

## 2020-02-09 ENCOUNTER — Ambulatory Visit: Payer: Medicare PPO | Attending: Internal Medicine

## 2020-02-09 DIAGNOSIS — Z23 Encounter for immunization: Secondary | ICD-10-CM | POA: Insufficient documentation

## 2020-02-09 NOTE — Progress Notes (Signed)
   Covid-19 Vaccination Clinic  Name:  Brittney Salinas    MRN: [id number] DOB: 17-Jan-1951  02/09/2020  Brittney Salinas was observed post Covid-19 immunization for 15 minutes without incidence. She was provided with Vaccine Information Sheet and instruction to access the V-Safe system.   Brittney Salinas was instructed to call 911 with any severe reactions post vaccine: Marland Kitchen Difficulty breathing  . Swelling of your face and throat  . A fast heartbeat  . A bad rash all over your body  . Dizziness and weakness    Immunizations Administered    Name Date Dose VIS Date Route   Pfizer COVID-19 Vaccine 02/09/2020  9:58 AM 0.3 mL 12/05/2019 Intramuscular   Manufacturer: [employer], [employer]   Lot: [id number]   NDC: [address]-3
